# Patient Record
Sex: Male | Born: 1960 | Race: White | Hispanic: No | Marital: Married | Smoking: Never smoker
Health system: Southern US, Community
[De-identification: ages and names within clinical notes are randomized; demographics above are authoritative.]

## PROBLEM LIST (undated history)

## (undated) DIAGNOSIS — M1990 Unspecified osteoarthritis, unspecified site: Secondary | ICD-10-CM

## (undated) DIAGNOSIS — K219 Gastro-esophageal reflux disease without esophagitis: Secondary | ICD-10-CM

## (undated) DIAGNOSIS — E7800 Pure hypercholesterolemia, unspecified: Secondary | ICD-10-CM

## (undated) DIAGNOSIS — L719 Rosacea, unspecified: Secondary | ICD-10-CM

## (undated) DIAGNOSIS — Z8719 Personal history of other diseases of the digestive system: Secondary | ICD-10-CM

## (undated) DIAGNOSIS — F419 Anxiety disorder, unspecified: Secondary | ICD-10-CM

## (undated) DIAGNOSIS — R002 Palpitations: Secondary | ICD-10-CM

## (undated) DIAGNOSIS — I4891 Unspecified atrial fibrillation: Secondary | ICD-10-CM

## (undated) DIAGNOSIS — I10 Essential (primary) hypertension: Secondary | ICD-10-CM

## (undated) DIAGNOSIS — M48061 Spinal stenosis, lumbar region without neurogenic claudication: Secondary | ICD-10-CM

## (undated) DIAGNOSIS — K760 Fatty (change of) liver, not elsewhere classified: Secondary | ICD-10-CM

## (undated) HISTORY — DX: Rosacea, unspecified: L71.9

## (undated) HISTORY — DX: Anxiety disorder, unspecified: F41.9

## (undated) HISTORY — PX: APPENDECTOMY: SHX54

## (undated) HISTORY — PX: TONSILLECTOMY: SUR1361

## (undated) HISTORY — DX: Spinal stenosis, lumbar region without neurogenic claudication: M48.061

## (undated) HISTORY — PX: SHOULDER SURGERY: SHX246

## (undated) HISTORY — PX: KNEE SURGERY: SHX244

---

## 1997-10-15 ENCOUNTER — Ambulatory Visit (HOSPITAL_BASED_OUTPATIENT_CLINIC_OR_DEPARTMENT_OTHER): Admission: RE | Admit: 1997-10-15 | Discharge: 1997-10-15 | Payer: Self-pay | Admitting: Orthopedic Surgery

## 1997-12-31 ENCOUNTER — Encounter: Payer: Self-pay | Admitting: Emergency Medicine

## 1997-12-31 ENCOUNTER — Emergency Department (HOSPITAL_COMMUNITY): Admission: EM | Admit: 1997-12-31 | Discharge: 1997-12-31 | Payer: Self-pay | Admitting: Emergency Medicine

## 1998-11-05 ENCOUNTER — Encounter: Payer: Self-pay | Admitting: Internal Medicine

## 1998-11-05 ENCOUNTER — Ambulatory Visit (HOSPITAL_COMMUNITY): Admission: RE | Admit: 1998-11-05 | Discharge: 1998-11-05 | Payer: Self-pay | Admitting: Internal Medicine

## 1999-06-09 ENCOUNTER — Encounter: Payer: Self-pay | Admitting: Orthopedic Surgery

## 1999-06-09 ENCOUNTER — Encounter: Admission: RE | Admit: 1999-06-09 | Discharge: 1999-06-09 | Payer: Self-pay | Admitting: Orthopedic Surgery

## 1999-07-07 ENCOUNTER — Emergency Department (HOSPITAL_COMMUNITY): Admission: EM | Admit: 1999-07-07 | Discharge: 1999-07-07 | Payer: Self-pay | Admitting: Emergency Medicine

## 2003-05-29 ENCOUNTER — Emergency Department (HOSPITAL_COMMUNITY): Admission: EM | Admit: 2003-05-29 | Discharge: 2003-05-29 | Payer: Self-pay | Admitting: Emergency Medicine

## 2003-06-06 ENCOUNTER — Ambulatory Visit (HOSPITAL_BASED_OUTPATIENT_CLINIC_OR_DEPARTMENT_OTHER): Admission: RE | Admit: 2003-06-06 | Discharge: 2003-06-06 | Payer: Self-pay | Admitting: Urology

## 2003-06-06 ENCOUNTER — Encounter (INDEPENDENT_AMBULATORY_CARE_PROVIDER_SITE_OTHER): Payer: Self-pay | Admitting: Specialist

## 2003-06-06 ENCOUNTER — Ambulatory Visit (HOSPITAL_COMMUNITY): Admission: RE | Admit: 2003-06-06 | Discharge: 2003-06-06 | Payer: Self-pay | Admitting: Urology

## 2005-03-22 HISTORY — PX: CARDIAC CATHETERIZATION: SHX172

## 2005-07-13 ENCOUNTER — Ambulatory Visit: Payer: Self-pay | Admitting: Internal Medicine

## 2005-07-16 ENCOUNTER — Ambulatory Visit: Payer: Self-pay | Admitting: Internal Medicine

## 2005-07-16 ENCOUNTER — Inpatient Hospital Stay (HOSPITAL_BASED_OUTPATIENT_CLINIC_OR_DEPARTMENT_OTHER): Admission: RE | Admit: 2005-07-16 | Discharge: 2005-07-16 | Payer: Self-pay | Admitting: Internal Medicine

## 2005-07-21 ENCOUNTER — Ambulatory Visit: Payer: Self-pay | Admitting: Cardiology

## 2005-08-18 ENCOUNTER — Ambulatory Visit: Payer: Self-pay | Admitting: Internal Medicine

## 2005-08-18 ENCOUNTER — Ambulatory Visit: Payer: Self-pay

## 2005-08-18 ENCOUNTER — Encounter: Payer: Self-pay | Admitting: Cardiology

## 2005-08-20 ENCOUNTER — Ambulatory Visit: Payer: Self-pay | Admitting: Internal Medicine

## 2006-11-01 ENCOUNTER — Encounter (HOSPITAL_BASED_OUTPATIENT_CLINIC_OR_DEPARTMENT_OTHER): Payer: Self-pay | Admitting: General Surgery

## 2006-11-01 ENCOUNTER — Inpatient Hospital Stay (HOSPITAL_COMMUNITY): Admission: EM | Admit: 2006-11-01 | Discharge: 2006-11-02 | Payer: Self-pay | Admitting: Emergency Medicine

## 2006-12-02 ENCOUNTER — Encounter: Admission: RE | Admit: 2006-12-02 | Discharge: 2006-12-02 | Payer: Self-pay | Admitting: General Surgery

## 2008-08-24 ENCOUNTER — Emergency Department (HOSPITAL_COMMUNITY): Admission: EM | Admit: 2008-08-24 | Discharge: 2008-08-24 | Payer: Self-pay | Admitting: Emergency Medicine

## 2009-01-02 ENCOUNTER — Emergency Department (HOSPITAL_COMMUNITY): Admission: EM | Admit: 2009-01-02 | Discharge: 2009-01-02 | Payer: Self-pay | Admitting: Emergency Medicine

## 2010-06-25 LAB — POCT CARDIAC MARKERS
CKMB, poc: <1 ng/mL — ABNORMAL LOW (ref 1.0–8.0)
CKMB, poc: <1 ng/mL — ABNORMAL LOW (ref 1.0–8.0)
Myoglobin, poc: 66.8 ng/mL (ref 12–200)
Myoglobin, poc: 76.2 ng/mL (ref 12–200)
Troponin i, poc: <0.05 ng/mL (ref 0.00–0.09)
Troponin i, poc: <0.05 ng/mL (ref 0.00–0.09)

## 2010-08-04 NOTE — Op Note (Signed)
[name], Vincent Summers                ACCOUNT NO.:  [id number]   MEDICAL RECORD NO.:  [id number]          PATIENT TYPE:  INP   LOCATION:  5731                         FACILITY:  [facility]   PHYSICIAN:  Leonie Man, M.D.   DATE OF BIRTH:  11-22-1960   DATE OF PROCEDURE:  11/01/2006  DATE OF DISCHARGE:  11/02/2006                               OPERATIVE REPORT   PREOPERATIVE DIAGNOSIS:  Acute appendicitis.   POSTOPERATIVE DIAGNOSIS:  Acute appendicitis.   PROCEDURE:  Laparoscopic appendectomy.   SURGEON:  Leonie Man, M.D.   ASSISTANT:  Starling Manns, PA student   ANESTHESIA:  General.   NOTE:  Mr. Kamaka is a 50 year old man who started experiencing right  lower quadrant abdominal pain approximately 24 hours prior to admission.  This was associated with nausea but without vomiting.  The patient  presented to the emergency room where he was evaluated by CT scanning  which showed evidence suggestive of acute appendicitis.  The patient  comes to the operating room now after risks and potential benefits of  surgery have been discussed, all questions answered, and consent for  surgery obtained.   Following induction of satisfactory general anesthesia, the patient  positioned supinely, the abdomen prepped and draped to be included in a  sterile operative field.  Positive identification of the patient is  [name] was carried out.  An open laparoscopy is created at the  umbilicus with a supraumbilical incision, deepened through skin and  subcutaneous tissues through the linea alba into the peritoneal cavity.  A Hassan cannula was inserted and insufflation of the peritoneal cavity  to 14-15 mmHg pressure with carbon dioxide carried out.  The camera was  inserted. Visual exploration of the abdomen was carried out.  In the  right lower quadrant, there were some adhesions to the sidewall and a  partially retrocecal appendix was noted.  Under direct vision,  suprapubic and left lower  quadrant incisions were made and trocars  inserted.   The appendix was grasped and dissected free from surrounding organs.  I  carried the dissection up along the mesoappendix using the harmonic  scalpel to divide the mesoappendix.  The base of the appendix was then  transected with an Endo GIA staple vascular type.  The suture line was  noted to be clear without any additional bleeding.  The appendix was  placed in an Endopouch and retrieved through the umbilical wound without  difficulty.   The right lower quadrant was then thoroughly irrigated with multiple  aliquots of normal saline.  All trocars were then removed under direct  vision.  Sponge, instrument, and sharp counts were verified and the  wound was closed in layers as follows.  The left lower quadrant and  suprapubic wounds closed with a running 4-0 Monocryl suture.  The  umbilical wound closed in layers with a 0 Vicryl and 4-0 Monocryl.  These wounds both had Steri-Strips applied.  A sterile dressing was  applied, the anesthetic reversed, the patient removed from the operating  room to the recovery room in stable condition.  He tolerated the  procedure well.      Leonie Man, M.D.  Electronically Signed     PB/MEDQ  D:  11/01/2006  T:  11/02/2006  Job:  [id number]

## 2010-08-04 NOTE — H&P (Signed)
NAMEGINA, Summers                ACCOUNT NO.:  [id number]   MEDICAL RECORD NO.:  [id number]          Summers TYPE:  EMS   LOCATION:  MAJO                         FACILITY:  [facility]   PHYSICIAN:  Leonie Man, M.D.   DATE OF BIRTH:  1960-12-19   DATE OF ADMISSION:  11/01/2006  DATE OF DISCHARGE:                              HISTORY & PHYSICAL   CHIEF COMPLAINT:  Right lower quadrant abdominal pain.   HISTORY OF PRESENT ILLNESS:  Mr. Vincent Summers who  developed abrupt onset of sharp pain in the right lower quadrant on  October 31, 2006.  This pain only lasted a few minutes and resolved.  He  was awakened at 3:00 a.m. this morning with recurrence of this pain  which was much more intense and more constant.  He had subjective  fevers, he had chills, he had nausea without vomiting and loose stools.  He presented to the ER where he was found to have leukocytosis. A CT was  done that demonstrated findings consistent with an appendicitis.   REVIEW OF SYSTEMS:  The Summers has noticed increased pain with  activity. This is better since he received pain medication in the ER.   PAST MEDICAL HISTORY:  1. Hypertension.  2. GERD.   PAST SURGICAL HISTORY:  1. Five separate knee surgeries.  2. One shoulder surgery.  3. I&D of groin abscess.  4. Tonsillectomy.   FAMILY HISTORY:  Noncontributory.   SOCIAL HISTORY:  No alcohol and no tobacco.   DRUG ALLERGIES:  Paxil.   CURRENT MEDICATIONS:  1. Coreg dosage unknown.  2. Nexium 40 mg daily.   PHYSICAL EXAMINATION:  GENERAL:  Pleasant male Summers currently  reporting improved right lower quadrant pain after medications.  VITAL SIGNS:  Temperature 98.3, BP 151/96, pulse 73 and regular,  respirations 20.  NEURO:  Alert and oriented x3, moving all extremities x4.  No focal  deficits.  HEENT:  Head normocephalic.  Sclera noninjected.  NECK:  Supple. No appreciable adenopathy.  CHEST:  Bilateral lung sounds, clear  to auscultation.  Respiratory  effort nonlabored.  Room air.  CARDIAC:  S1, S2, no rubs, murmurs, thrills or gallops.  Pulses regular.  No JVD.  ABDOMEN:  Soft, nondistended.  Bowel sounds are present but diminished.  He is tender in the right lower quadrant with minimal guarding.  No  rebounding.  EXTREMITIES:  Symmetrical in appearance without edema, cyanosis or  clubbing.  Pulses are palpable.   LABORATORY DATA:  White count 14,700, neutrophils 82%, hemoglobin 15.2,  platelets 233,000.  Urinalysis is negative.  CT again shows trace fluid  in the pelvis consistent with probable appendicitis.  There is also an  indeterminate lesion in the upper pole of the left kidney that  radiologist is recommending additional imaging.   IMPRESSION:  1. Acute appendicitis.  2. Indeterminate lesion in upper pole left kidney.  3. Hypertension.   PLAN:  1. Admit the Summers to 5700.  2. Operative intervention today, a laparoscopic appendectomy per Dr.      Lurene Shadow. The risks and benefits discussed  with the Summers per Dr.      Lurene Shadow.  3. Empiric Unasyn for empiric antibiotic coverage.  4. Dilaudid for pain and Zofran for nausea.   At discharge, the Summers will need to be given a reminder that he needs  a contrast enhanced CT or MRI to evaluate the lesion on the left kidney.  We will need to communicate this information to him and to his primary  care physician at the time of discharge.      Allison L. Rennis Harding, N.P.      Leonie Man, M.D.  Electronically Signed    ALE/MEDQ  D:  11/01/2006  T:  11/02/2006  Job:  [id number]

## 2010-08-07 NOTE — Cardiovascular Report (Signed)
NAME:  Vincent Summers, Vincent Summers NO.:  [id number]   MEDICAL RECORD NO.:  [id number]          PATIENT TYPE:  OIB   LOCATION:  1966                         FACILITY:  [facility]   PHYSICIAN:  Arvilla Meres, M.D. LHCDATE OF BIRTH:  03/15/1961   DATE OF PROCEDURE:  07/16/2005  DATE OF DISCHARGE:                              CARDIAC CATHETERIZATION   PATIENT IDENTIFICATION:  Vincent Summers is a very pleasant 50 year old male with  a history of hypertension and hyperlipidemia as well as a strong family  history of coronary artery disease.  He has been having about a month  history of exertional chest pain concerning for angina.  He is, thus,  referred for cardiac catheterization in the outpatient diagnostic lab.   PROCEDURES PERFORMED:  1.  Selective coronary artery angiography.  2.  Left heart catheterization.  3.  Left ventriculogram.  4.  Abdominal aortogram.   DESCRIPTION OF PROCEDURE:  The risks and benefits of catheterization were  explained.  Consent was signed and placed on the chart.  A 4-French arterial  sheath was placed in the right femoral artery using a modified Seldinger  technique.  A JL-5 catheter was used to image the left coronary system and a  3-DRC was used to image.  An angled pigtail was used for the left heart  catheterization.  All catheter exchanges were made over a wire.  There are  no apparent complications.   Central aortic pressure 133/83 with mean of 107.  LV pressure 14/9 with  LVEDP of 22.  There is no aortic stenosis.   The left main was long and angiographically normal.   The LAD was a moderate sized vessel that gave off two large diagonals, the  second diagonal being about as large if not larger than the LAD.  There are  minor luminal irregularties in the diagonal.   The left circumflex is a moderate sized system made up primarily of a large  OM1.  The distal AV groove circumflex was small.  There is no angiographic  CAD.   The right  coronary artery was a very large dominant vessel that gave off a  small RV branch, a very large PDA, and two moderate sized PLs.  There is no  angiographic CAD.   The left ventriculogram done in the RAO position showed an EF of 60% with no  obvious wall motion abnormalities.   Abdominal aortogram showed patent renal arteries bilaterally with no  evidence of abdominal aortoiliac disease.   ASSESSMENT:  1.  Essentially normal coronary arteries.  2.  Normal LV function with increase LVEDP consistent with diastolic      dysfunction.  3.  Normal renal arteries with no evidence of abdominal aortic aneurysm.   PLAN/DISCUSSION:  I am unsure what is causing Vincent Summers's chest pressure,  however, it does not appear to be cardiac.  We will continue with risk  factor modification.  Should it persist, we will consider workup for GI  causes versus possible pulmonary embolus, though I suspect the latter is  unlikely.      Arvilla Meres, M.D. [facility]  Electronically Signed     DB/MEDQ  D:  07/16/2005  T:  07/17/2005  Job:  [id number]   cc:   Johnney Ou, M.D.

## 2010-08-07 NOTE — Op Note (Signed)
NAME:  JOH, RAO                          ACCOUNT NO.:  [id number]   MEDICAL RECORD NO.:  [id number]                   PATIENT TYPE:  AMB   LOCATION:  [facility]                                 [facility]   PHYSICIAN:  Excell Seltzer. Annabell Howells, M.D.                 DATE OF BIRTH:  Sep 09, 1960   DATE OF PROCEDURE:  06/06/2003  DATE OF DISCHARGE:                                 OPERATIVE REPORT   PROCEDURE:  Excision of scrotal phlegmon with 5 cm intermediate closure.   PREOPERATIVE DIAGNOSES:  Scrotal abscess.   POSTOPERATIVE DIAGNOSES:  Scrotal abscess.   SURGEON:  Excell Seltzer. Annabell Howells, M.D.   ANESTHESIA:  General.   COMPLICATIONS:  None.   INDICATIONS FOR PROCEDURE:  Mr. Zerby is a 50 year old white male who  initially presented with what appeared to be a left upper scrotal abscess.  He underwent incision and drainage in the emergency room and was given  Augmentin.  He was seen by me in followup a few days later and had  persistent erythema and adenopathy in the right inguinal area with  persistent phlegmon.  He underwent reincision and drainage. There was  minimal purulent material. His antibiotics were augmented with Levaquin. He  returned three days later in followup and had persistent erythema and  adenopathy with a persistent phlegmon. I have switched him to doxycycline  100 mg p.o. b.i.d. and felt that excision of the phlegmon was indicated to  rule out any sort of necrotizing process.   DESCRIPTION OF PROCEDURE:  The patient was taken to the operating room where  general anesthesia was induced. The scrotum and left inguinal area were  shaved, he was prepped with Betadine solution and draped in the usual  sterile fashion.  The scrotal phlegmon at the left base of the penis was  excised with an elliptical skin excision and removed using the Bovie.  There  was no evidence of a fistulous tract or deep invasion although it was full  thickness through the scrotal wall. Once the phlegmon  was excised,  hemostasis was achieved.  A 1/4 inch Penrose drain was placed through a  separate stab wound inferior to the incision. The incision was then closed  using interrupted 3-0 chromic on the dartos and interrupted vertical  mattress 3-0 chromic on the skin. A dressing of 4 x 4, Kerlix and athletic  supporter was applied. This specimen was sent to pathology for microscopy  and cultures for aerobes, anaerobes, AFB and chlamydia.                                               Excell Seltzer. Annabell Howells, M.D.    JJW/MEDQ  D:  06/06/2003  T:  06/06/2003  Job:  [id number]

## 2011-01-04 LAB — DIFFERENTIAL
Basophils Relative: 0
Eosinophils Absolute: 0.2
Eosinophils Relative: 2
Monocytes Absolute: 1 — ABNORMAL HIGH
Monocytes Relative: 7

## 2011-01-04 LAB — CBC
HCT: 43.3
Hemoglobin: 15.2
MCHC: 35.1
MCV: 88.3
RBC: 4.91

## 2011-01-04 LAB — URINALYSIS, ROUTINE W REFLEX MICROSCOPIC
Bilirubin Urine: NEGATIVE
Hgb urine dipstick: NEGATIVE
Ketones, ur: NEGATIVE
Specific Gravity, Urine: 1.02
pH: 6

## 2011-01-04 LAB — BASIC METABOLIC PANEL
CO2: 23
Chloride: 104
GFR calc Af Amer: >60
Sodium: 135

## 2011-04-04 ENCOUNTER — Encounter (HOSPITAL_COMMUNITY): Payer: Self-pay | Admitting: *Deleted

## 2011-04-04 ENCOUNTER — Emergency Department (HOSPITAL_COMMUNITY)
Admission: EM | Admit: 2011-04-04 | Discharge: 2011-04-04 | Disposition: A | Discharge status: Home / Self Care | Payer: BC Managed Care – PPO | Attending: Emergency Medicine | Admitting: Emergency Medicine

## 2011-04-04 DIAGNOSIS — K089 Disorder of teeth and supporting structures, unspecified: Secondary | ICD-10-CM | POA: Insufficient documentation

## 2011-04-04 DIAGNOSIS — K029 Dental caries, unspecified: Secondary | ICD-10-CM | POA: Insufficient documentation

## 2011-04-04 DIAGNOSIS — K0889 Other specified disorders of teeth and supporting structures: Secondary | ICD-10-CM

## 2011-04-04 DIAGNOSIS — I10 Essential (primary) hypertension: Secondary | ICD-10-CM | POA: Insufficient documentation

## 2011-04-04 DIAGNOSIS — Z79899 Other long term (current) drug therapy: Secondary | ICD-10-CM | POA: Insufficient documentation

## 2011-04-04 HISTORY — DX: Essential (primary) hypertension: I10

## 2011-04-04 MED ORDER — HYDROCODONE-ACETAMINOPHEN 5-325 MG PO TABS
2.0000 | ORAL_TABLET | Freq: Once | ORAL | Status: AC
  Administered 2011-04-04: 2 via ORAL
  Filled 2011-04-04 (×2): qty 1

## 2011-04-04 MED ORDER — HYDROCODONE-ACETAMINOPHEN 5-325 MG PO TABS: 2.0000 | ORAL_TABLET | ORAL | Status: AC | PRN

## 2011-04-04 MED ORDER — PENICILLIN V POTASSIUM 250 MG PO TABS
500.0000 mg | ORAL_TABLET | Freq: Once | ORAL | Status: AC
  Administered 2011-04-04: 500 mg via ORAL
  Filled 2011-04-04: qty 2

## 2011-04-04 MED ORDER — PENICILLIN V POTASSIUM 500 MG PO TABS: 500.0000 mg | ORAL_TABLET | Freq: Three times a day (TID) | ORAL | Status: AC

## 2011-04-04 NOTE — ED Provider Notes (Signed)
History     CSN: [id number]  Arrival date & time 04/04/11  2011   First MD Initiated Contact with Patient 04/04/11 2150      Chief Complaint  Patient presents with  . Dental Pain    (Consider location/radiation/quality/duration/timing/severity/associated sxs/prior treatment) HPI Complains of toothache at right lower 2 molars onset 2 days ago when his dental cap Fell off. Treated with ibuprofen without adequate pain relief no fever no other complaint no other associated symptoms pain is worse with palpation not improved by anything nonradiating moderate to severe Past Medical History  Diagnosis Date  . Hypertension     History reviewed. No pertinent past surgical history.  History reviewed. No pertinent family history.  History  Substance Use Topics  . Smoking status: Never Smoker   . Smokeless tobacco: Not on file  . Alcohol Use: Yes      Review of Systems  Constitutional: Negative.   HENT:       Toothache  Respiratory: Negative.   Cardiovascular: Negative.   Gastrointestinal: Negative.   Musculoskeletal: Negative.   Skin: Negative.   Neurological: Negative.   Hematological: Negative.   Psychiatric/Behavioral: Negative.     Allergies  Review of patient's allergies indicates no known allergies.  Home Medications   Current Outpatient Rx  Name Route Sig Dispense Refill  . BENAZEPRIL HCL 5 MG PO TABS Oral Take 5 mg by mouth daily.    . IBUPROFEN 600 MG PO TABS Oral Take 600 mg by mouth every 4 (four) hours as needed. For pain    . OMEPRAZOLE 20 MG PO CPDR Oral Take 20 mg by mouth daily.      BP 146/80  Pulse 76  Temp(Src) 98.4 F (36.9 C) (Oral)  Resp 15  SpO2 97%  Physical Exam  Constitutional: He appears well-developed and well-nourished. No distress.  HENT:  Head: Normocephalic and atraumatic.       Dental carry at right lower third molar; dental cap missing from right lower second molar uvula midline no trismus. No fluctuance of gingivae  Eyes:  Conjunctivae are normal. Pupils are equal, round, and reactive to light.  Neck: Neck supple. No tracheal deviation present. No thyromegaly present.  Cardiovascular: Normal rate.   Pulmonary/Chest: Effort normal.  Abdominal: He exhibits no distension.  Musculoskeletal: Normal range of motion. He exhibits no edema.  Neurological: He is alert.  Skin: Skin is warm and dry. No rash noted.  Psychiatric: He has a normal mood and affect.    ED Course  Procedures (including critical care time)  Labs Reviewed - No data to display No results found.   No diagnosis found.    MDM  Plan prescriptions hydrocodone-A. Pap Pen-Vee K Dental referral Diagnosis dental pain        Doug Sou, MD 04/04/11 2202

## 2011-04-04 NOTE — ED Notes (Signed)
The pt has had a toothache since Friday when a cap fell off

## 2011-05-31 ENCOUNTER — Emergency Department (HOSPITAL_COMMUNITY)
Admission: EM | Admit: 2011-05-31 | Discharge: 2011-05-31 | Disposition: A | Discharge status: Home / Self Care | Payer: BC Managed Care – PPO | Attending: Emergency Medicine | Admitting: Emergency Medicine

## 2011-05-31 ENCOUNTER — Encounter (HOSPITAL_COMMUNITY): Payer: Self-pay

## 2011-05-31 DIAGNOSIS — M25559 Pain in unspecified hip: Secondary | ICD-10-CM | POA: Insufficient documentation

## 2011-05-31 DIAGNOSIS — IMO0001 Reserved for inherently not codable concepts without codable children: Secondary | ICD-10-CM | POA: Insufficient documentation

## 2011-05-31 DIAGNOSIS — I10 Essential (primary) hypertension: Secondary | ICD-10-CM | POA: Insufficient documentation

## 2011-05-31 DIAGNOSIS — M545 Low back pain, unspecified: Secondary | ICD-10-CM | POA: Insufficient documentation

## 2011-05-31 DIAGNOSIS — M549 Dorsalgia, unspecified: Secondary | ICD-10-CM

## 2011-05-31 LAB — URINALYSIS, ROUTINE W REFLEX MICROSCOPIC
Glucose, UA: NEGATIVE mg/dL
Ketones, ur: NEGATIVE mg/dL
Leukocytes, UA: NEGATIVE
pH: 5 (ref 5.0–8.0)

## 2011-05-31 MED ORDER — HYDROMORPHONE HCL PF 1 MG/ML IJ SOLN
1.0000 mg | Freq: Once | INTRAMUSCULAR | Status: AC
  Administered 2011-05-31: 1 mg via INTRAMUSCULAR
  Filled 2011-05-31: qty 1

## 2011-05-31 MED ORDER — IBUPROFEN 800 MG PO TABS: 800.0000 mg | ORAL_TABLET | Freq: Three times a day (TID) | ORAL | Status: AC

## 2011-05-31 MED ORDER — DIAZEPAM 5 MG PO TABS
5.0000 mg | ORAL_TABLET | Freq: Once | ORAL | Status: AC
  Administered 2011-05-31: 5 mg via ORAL
  Filled 2011-05-31: qty 1

## 2011-05-31 MED ORDER — KETOROLAC TROMETHAMINE 60 MG/2ML IM SOLN
60.0000 mg | Freq: Once | INTRAMUSCULAR | Status: AC
  Administered 2011-05-31: 60 mg via INTRAMUSCULAR
  Filled 2011-05-31: qty 2

## 2011-05-31 MED ORDER — OXYCODONE-ACETAMINOPHEN 5-325 MG PO TABS: 1.0000 | ORAL_TABLET | Freq: Four times a day (QID) | ORAL | Status: AC | PRN

## 2011-05-31 MED ORDER — DIAZEPAM 5 MG PO TABS: 5.0000 mg | ORAL_TABLET | Freq: Three times a day (TID) | ORAL | Status: AC | PRN

## 2011-05-31 NOTE — ED Notes (Signed)
Lower back pain last week, denies any injuries.  Pain has become severe.  Pt. Denies any hematuria or dsyuria

## 2011-05-31 NOTE — ED Provider Notes (Signed)
Medical screening examination/treatment/procedure(s) were performed by non-physician practitioner and as supervising physician I was immediately available for consultation/collaboration.   Loren Racer, MD 05/31/11 [phone]

## 2011-05-31 NOTE — Discharge Instructions (Signed)
Back Pain, Adult Low back pain is very common. About 1 in 5 people have back pain.The cause of low back pain is rarely dangerous. The pain often gets better over time.About half of people with a sudden onset of back pain feel better in just 2 weeks. About 8 in 10 people feel better by 6 weeks.  CAUSES Some common causes of back pain include:  Strain of the muscles or ligaments supporting the spine.   Wear and tear (degeneration) of the spinal discs.   Arthritis.   Direct injury to the back.  DIAGNOSIS Most of the time, the direct cause of low back pain is not known.However, back pain can be treated effectively even when the exact cause of the pain is unknown.Answering your caregiver's questions about your overall health and symptoms is one of the most accurate ways to make sure the cause of your pain is not dangerous. If your caregiver needs more information, he or she may order lab work or imaging tests (X-rays or MRIs).However, even if imaging tests show changes in your back, this usually does not require surgery. HOME CARE INSTRUCTIONS For many people, back pain returns.Since low back pain is rarely dangerous, it is often a condition that people can learn to manageon their own.   Remain active. It is stressful on the back to sit or stand in one place. Do not sit, drive, or stand in one place for more than 30 minutes at a time. Take short walks on level surfaces as soon as pain allows.Try to increase the length of time you walk each day.   Do not stay in bed.Resting more than 1 or 2 days can delay your recovery.   Do not avoid exercise or work.Your body is made to move.It is not dangerous to be active, even though your back may hurt.Your back will likely heal faster if you return to being active before your pain is gone.   Pay attention to your body when you bend and lift. Many people have less discomfortwhen lifting if they bend their knees, keep the load close to their  bodies,and avoid twisting. Often, the most comfortable positions are those that put less stress on your recovering back.   Find a comfortable position to sleep. Use a firm mattress and lie on your side with your knees slightly bent. If you lie on your back, put a pillow under your knees.   Only take over-the-counter or prescription medicines as directed by your caregiver. Over-the-counter medicines to reduce pain and inflammation are often the most helpful.Your caregiver may prescribe muscle relaxant drugs.These medicines help dull your pain so you can more quickly return to your normal activities and healthy exercise.   Put ice on the injured area.   Put ice in a plastic bag.   Place a towel between your skin and the bag.   Leave the ice on for 15 to 20 minutes, 3 to 4 times a day for the first 2 to 3 days. After that, ice and heat may be alternated to reduce pain and spasms.   Ask your caregiver about trying back exercises and gentle massage. This may be of some benefit.   Avoid feeling anxious or stressed.Stress increases muscle tension and can worsen back pain.It is important to recognize when you are anxious or stressed and learn ways to manage it.Exercise is a great option.  SEEK MEDICAL CARE IF:  You have pain that is not relieved with rest or medicine.   You have   pain that does not improve in 1 week.   You have new symptoms.   You are generally not feeling well.  SEEK IMMEDIATE MEDICAL CARE IF:   You have pain that radiates from your back into your legs.   You develop new bowel or bladder control problems.   You have unusual weakness or numbness in your arms or legs.   You develop nausea or vomiting.   You develop abdominal pain.   You feel faint.  Document Released: 03/08/2005 Document Revised: 02/25/2011 Document Reviewed: 07/27/2010 [facility] Patient Information 2012 [place], [place].        Diazepam tablets What is this medicine? DIAZEPAM (dye AZ e  pam) is a benzodiazepine. It is used to treat anxiety and nervousness. It also can help treat alcohol withdrawal, relax muscles, and treat certain types of seizures. This medicine may be used for other purposes; ask your health care provider or pharmacist if you have questions. What should I tell my health care provider before I take this medicine? They need to know if you have any of these conditions -an alcohol or drug abuse problem -bipolar disorder, depression, psychosis or other mental health condition -glaucoma -kidney or liver disease -lung or breathing disease -myasthenia gravis -Parkinson's disease -seizures or a history of seizures -suicidal thoughts -an unusual or allergic reaction to diazepam, other benzodiazepines, foods, dyes, or preservatives -pregnant or trying to get pregnant -breast-feeding How should I use this medicine? Take this medicine by mouth with a glass of water. Follow the directions on the prescription label. If this medicine upsets your stomach, take it with food or milk. Take your doses at regular intervals. Do not take your medicine more often than directed. If you have been taking this medicine regularly for some time, do not suddenly stop taking it. You must gradually reduce the dose or you may get severe side effects. Ask your doctor or health care professional for advice. Even after you stop taking this medicine it can still affect your body for several days. Talk to your pediatrician regarding the use of this medicine in children. Special care may be needed. Overdosage: If you think you have taken too much of this medicine contact a poison control center or emergency room at once. NOTE: This medicine is only for you. Do not share this medicine with others. What if I miss a dose? If you miss a dose, take it as soon as you can. If it is almost time for your next dose, take only that dose. Do not take double or extra doses. What may interact with this  medicine? -cimetidine -grapefruit juice -herbal or dietary supplements like kava kava, melatonin, St. John's Wort, or valerian -medicines for anxiety or sleeping problems, like alprazolam, lorazepam, or triazolam -medicines for depression, mental problems or psychiatric disturbances -medicines for HIV infection or AIDS -prescription pain medicines -rifampin, rifapentine, or rifabutin -some medicines for seizures like carbamazepine, phenobarbital, phenytoin, or primidone This list may not describe all possible interactions. Give your health care provider a list of all the medicines, herbs, non-prescription drugs, or dietary supplements you use. Also tell them if you smoke, drink alcohol, or use illegal drugs. Some items may interact with your medicine. What should I watch for while using this medicine? Visit your doctor or health care professional for regular checks on your progress. Your body can become dependent on this medicine. Ask your doctor or health care professional if you still need to take it. You may get drowsy or dizzy. Do  not drive, use machinery, or do anything that needs mental alertness until you know how this medicine affects you. To reduce the risk of dizzy and fainting spells, do not stand or sit up quickly, especially if you are an older patient. Alcohol may increase dizziness and drowsiness. Avoid alcoholic drinks. Do not treat yourself for coughs, colds or allergies without asking your doctor or health care professional for advice. Some ingredients can increase possible side effects. What side effects may I notice from receiving this medicine? Side effects that you should report to your doctor or health care professional as soon as possible: -allergic reactions like skin rash, itching or hives, swelling of the face, lips, or tongue -angry, confused, depressed, other mood changes -breathing problems -feeling faint or lightheaded, falls -muscle cramps -problems with balance,  talking, walking -restlessness -tremors -trouble passing urine or change in the amount of urine -unusually weak or tired Side effects that usually do not require medical attention (report to your doctor or health care professional if they continue or are bothersome): -difficulty sleeping, nightmares -dizziness, drowsiness, clumsiness, or unsteadiness, a hangover effect -headache -nausea, vomiting This list may not describe all possible side effects. Call your doctor for medical advice about side effects. You may report side effects to FDA at 1-800-FDA-1088. Where should I keep my medicine? Keep out of the reach of children. This medicine can be abused. Keep your medicine in a safe place to protect it from theft. Do not share this medicine with anyone. Selling or giving away this medicine is dangerous and against the law. Store at room temperature between 15 and 30 degrees C (59 and 86 degrees F). Protect from light. Keep container tightly closed. Throw away any unused medicine after the expiration date. NOTE: This sheet is a summary. It may not cover all possible information. If you have questions about this medicine, talk to your doctor, pharmacist, or health care provider.  2012, Elsevier/Gold Standard. (06/26/2007 4:57:35 PM)        Acetaminophen; Oxycodone tablets What is this medicine? ACETAMINOPHEN; OXYCODONE (a set a MEE noe fen; ox i KOE done) is a pain reliever. It is used to treat mild to moderate pain. This medicine may be used for other purposes; ask your health care provider or pharmacist if you have questions. What should I tell my health care provider before I take this medicine? They need to know if you have any of these conditions: -brain tumor -Crohn's disease, inflammatory bowel disease, or ulcerative colitis -drink more than 3 alcohol containing drinks per day -drug abuse or addiction -head injury -heart or circulation problems -kidney disease or problems going  to the bathroom -liver disease -lung disease, asthma, or breathing problems -an unusual or allergic reaction to acetaminophen, oxycodone, other opioid analgesics, other medicines, foods, dyes, or preservatives -pregnant or trying to get pregnant -breast-feeding How should I use this medicine? Take this medicine by mouth with a full glass of water. Follow the directions on the prescription label. Take your medicine at regular intervals. Do not take your medicine more often than directed. Talk to your pediatrician regarding the use of this medicine in children. Special care may be needed. Patients over 79 years old may have a stronger reaction and need a smaller dose. Overdosage: If you think you have taken too much of this medicine contact a poison control center or emergency room at once. NOTE: This medicine is only for you. Do not share this medicine with others. What if I miss a  dose? If you miss a dose, take it as soon as you can. If it is almost time for your next dose, take only that dose. Do not take double or extra doses. What may interact with this medicine? -alcohol or medicines that contain alcohol -antihistamines -barbiturates like amobarbital, butalbital, butabarbital, methohexital, pentobarbital, phenobarbital, thiopental, and secobarbital -benztropine -drugs for bladder problems like solifenacin, trospium, oxybutynin, tolterodine, hyoscyamine, and methscopolamine -drugs for breathing problems like ipratropium and tiotropium -drugs for certain stomach or intestine problems like propantheline, homatropine methylbromide, glycopyrrolate, atropine, belladonna, and dicyclomine -general anesthetics like etomidate, ketamine, nitrous oxide, propofol, desflurane, enflurane, halothane, isoflurane, and sevoflurane -medicines for depression, anxiety, or psychotic disturbances -medicines for pain like codeine, morphine, pentazocine, buprenorphine, butorphanol, nalbuphine, tramadol, and  propoxyphene -medicines for sleep -muscle relaxants -naltrexone -phenothiazines like perphenazine, thioridazine, chlorpromazine, mesoridazine, fluphenazine, prochlorperazine, promazine, and trifluoperazine -scopolamine -trihexyphenidyl This list may not describe all possible interactions. Give your health care provider a list of all the medicines, herbs, non-prescription drugs, or dietary supplements you use. Also tell them if you smoke, drink alcohol, or use illegal drugs. Some items may interact with your medicine. What should I watch for while using this medicine? Tell your doctor or health care professional if your pain does not go away, if it gets worse, or if you have new or a different type of pain. You may develop tolerance to the medicine. Tolerance means that you will need a higher dose of the medication for pain relief. Tolerance is normal and is expected if you take this medicine for a long time. Do not suddenly stop taking your medicine because you may develop a severe reaction. Your body becomes used to the medicine. This does NOT mean you are addicted. Addiction is a behavior related to getting and using a drug for a nonmedical reason. If you have pain, you have a medical reason to take pain medicine. Your doctor will tell you how much medicine to take. If your doctor wants you to stop the medicine, the dose will be slowly lowered over time to avoid any side effects. You may get drowsy or dizzy. Do not drive, use machinery, or do anything that needs mental alertness until you know how this medicine affects you. Do not stand or sit up quickly, especially if you are an older patient. This reduces the risk of dizzy or fainting spells. Alcohol may interfere with the effect of this medicine. Avoid alcoholic drinks. The medicine will cause constipation. Try to have a bowel movement at least every 2 to 3 days. If you do not have a bowel movement for 3 days, call your doctor or health care  professional. Do not take Tylenol (acetaminophen) or medicines that have acetaminophen with this medicine. Too much acetaminophen can be very dangerous. Many nonprescription medicines contain acetaminophen. Always read the labels carefully to avoid taking more acetaminophen. What side effects may I notice from receiving this medicine? Side effects that you should report to your doctor or health care professional as soon as possible: -allergic reactions like skin rash, itching or hives, swelling of the face, lips, or tongue -breathing difficulties, wheezing -confusion -light headedness or fainting spells -severe stomach pain -yellowing of the skin or the whites of the eyes Side effects that usually do not require medical attention (report to your doctor or health care professional if they continue or are bothersome): -dizziness -drowsiness -nausea -vomiting This list may not describe all possible side effects. Call your doctor for medical advice about side effects. You  may report side effects to FDA at 1-800-FDA-1088. Where should I keep my medicine? Keep out of the reach of children. This medicine can be abused. Keep your medicine in a safe place to protect it from theft. Do not share this medicine with anyone. Selling or giving away this medicine is dangerous and against the law. Store at room temperature between 20 and 25 degrees C (68 and 77 degrees F). Keep container tightly closed. Protect from light. Flush any unused medicines down the toilet. Do not use the medicine after the expiration date. NOTE: This sheet is a summary. It may not cover all possible information. If you have questions about this medicine, talk to your doctor, pharmacist, or health care provider.  2012, Elsevier/Gold Standard. (02/05/2008 10:01:21 AM)

## 2011-05-31 NOTE — ED Provider Notes (Signed)
History     CSN: [id number]  Arrival date & time 05/31/11  4098   First MD Initiated Contact with Patient 05/31/11 [phone]      Chief Complaint  Patient presents with  . Back Pain    (Consider location/radiation/quality/duration/timing/severity/associated sxs/prior treatment) Patient is a 51 y.o. male presenting with back pain. The history is provided by the patient and the spouse.  Back Pain  This is a new problem. Episode onset: 5 days ago. The problem occurs constantly. 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 Pain location: Left lower back and buttock. The quality of the pain is described as stabbing. The pain radiates to the left thigh. The pain is severe. The symptoms are aggravated by bending, twisting and certain positions. The pain is the same all the time. Associated symptoms include leg pain. Pertinent negatives include no chest pain, no fever, no numbness, no headaches, no abdominal pain, no bowel incontinence, no perianal numbness, no bladder incontinence, no dysuria, no pelvic pain, no paresthesias, no paresis, no tingling and no weakness. Treatments tried: Aspirin. The treatment provided no relief.   no history of HIV, cancer, IV drug use.  Past Medical History  Diagnosis Date  . Hypertension     Past Surgical History  Procedure Date  . Knee surgery     No family history on file.  History  Substance Use Topics  . Smoking status: Never Smoker   . Smokeless tobacco: Not on file  . Alcohol Use: Yes      Review of Systems  Constitutional: Negative for fever and chills.  Cardiovascular: Negative for chest pain.  Gastrointestinal: Negative for abdominal pain and bowel incontinence.  Genitourinary: Negative for bladder incontinence, dysuria and pelvic pain.  Musculoskeletal: Positive for back pain.  Neurological: Negative for tingling, weakness, numbness,  headaches and paresthesias.  All other systems reviewed and are negative.    Allergies  Review of patient's allergies indicates no known allergies.  Home Medications   Current Outpatient Rx  Name Route Sig Dispense Refill  . ASPIRIN 325 MG PO TABS Oral Take 325 mg by mouth daily as needed. For pain    . BENAZEPRIL HCL 5 MG PO TABS Oral Take 5 mg by mouth daily.    Marland Kitchen OMEPRAZOLE 20 MG PO CPDR Oral Take 20 mg by mouth daily.      BP 144/84  Pulse 77  Resp 18  Ht 6\' 2"  (1.88 m)  Wt 225 lb (102.059 kg)  BMI 28.89 kg/m2  SpO2 98%  Physical Exam  Nursing note and vitals reviewed. Constitutional: He is oriented to person, place, and time. He appears well-developed and well-nourished. Distressed: uncomfortable appearing.  HENT:  Head: Normocephalic and atraumatic.  Right Ear: External ear normal.  Left Ear: External ear normal.  Mouth/Throat: Oropharynx is clear and moist.  Eyes: Pupils are equal, round, and reactive to light.  Neck: Normal range of motion. Neck supple.  Cardiovascular: Normal rate, regular rhythm, normal heart sounds and intact distal pulses.   No murmur heard. Pulmonary/Chest: Effort normal and breath sounds normal. No respiratory distress. He has no wheezes. He exhibits no tenderness.  Abdominal: Soft. Bowel sounds are normal. He exhibits no distension. There is no tenderness. There is no rebound and no guarding.  Musculoskeletal: Normal range of motion. He exhibits no edema and no tenderness.       No C T L spine  tenderness, deformity, stepoff, or palpable spasm. Left straight leg raise, trunk rotation, trunk flexion all elicit pain over the left lower back, left buttock. Strength in BLE major muscle groups 5/5 and symmetric bilaterally  Neurological: He is alert and oriented to person, place, and time. He has normal reflexes. No cranial nerve deficit. Coordination normal.       Sensation intact to light touch and symmetric in BLE. Gait with antalgia but no  ataxia. Foot plantar/dorsi flexion and great toe flex/ext intact with 5/5 strength bilaterally.  Skin: Skin is warm and dry. No rash noted.  Psychiatric: He has a normal mood and affect.    ED Course  Procedures (including critical care time)   Labs Reviewed  URINALYSIS, ROUTINE W REFLEX MICROSCOPIC   No results found.   Dx 1: back pain   MDM  Relatively healthy 51 y/o M with new onset left lower back pain with suspected msk etiology. UA without blood to suggest ureteral colic. Pain reproducible with movement, supports muscular pain. No midline or SI joint tenderness to suggest imaging studies warranted. No back pain red flags. Pain improved after IM dilaudid. Will d/c home with muscle relaxer, pain medication, NSAID. Pt requests note for work as well. As he performs heavy lifting that I suspect is worsening his pain, will write out for several days.         Shaaron Adler, PA-C 05/31/11 1303

## 2011-06-03 ENCOUNTER — Encounter (HOSPITAL_COMMUNITY): Payer: Self-pay | Admitting: *Deleted

## 2011-06-03 ENCOUNTER — Emergency Department (HOSPITAL_COMMUNITY)
Admission: EM | Admit: 2011-06-03 | Discharge: 2011-06-03 | Disposition: A | Discharge status: Home / Self Care | Payer: BC Managed Care – PPO | Attending: Emergency Medicine | Admitting: Emergency Medicine

## 2011-06-03 DIAGNOSIS — M545 Low back pain, unspecified: Secondary | ICD-10-CM | POA: Insufficient documentation

## 2011-06-03 DIAGNOSIS — M79609 Pain in unspecified limb: Secondary | ICD-10-CM | POA: Insufficient documentation

## 2011-06-03 MED ORDER — DIAZEPAM 5 MG PO TABS: 5.0000 mg | ORAL_TABLET | Freq: Once | ORAL | Status: AC

## 2011-06-03 MED ORDER — DIAZEPAM 5 MG PO TABS
5.0000 mg | ORAL_TABLET | Freq: Once | ORAL | Status: AC
  Administered 2011-06-03: 5 mg via ORAL
  Filled 2011-06-03: qty 1

## 2011-06-03 MED ORDER — OXYCODONE-ACETAMINOPHEN 5-325 MG PO TABS: 1.0000 | ORAL_TABLET | Freq: Four times a day (QID) | ORAL | Status: AC | PRN

## 2011-06-03 MED ORDER — OXYCODONE-ACETAMINOPHEN 5-325 MG PO TABS
2.0000 | ORAL_TABLET | Freq: Once | ORAL | Status: AC
  Administered 2011-06-03: 2 via ORAL
  Filled 2011-06-03: qty 2

## 2011-06-03 NOTE — ED Provider Notes (Signed)
History     CSN: [id number]  Arrival date & time 06/03/11  2139   First MD Initiated Contact with Patient 06/03/11 2310      Chief Complaint  Patient presents with  . Back Pain    (Consider location/radiation/quality/duration/timing/severity/associated sxs/prior treatment) HPI Comments: Patient was initially seen on the 11th for acute onset of low back pain radiating to his left leg.  He was initially treated with Percocet and Valium, which she states does help.  He was put on home rest, which he says helps.  He started moving around more in the last one to 2 days and has had an increase of the low back pain.  He is currently out of his medication.  He does have an appointment with [facility] on Monday, which is his primary care practice is requesting additional medication.  Through that.  He denies injury, previous back injuries, trauma, loss of continence, and constipation  Patient is a 51 y.o. male presenting with back pain. The history is provided by the patient.  Back Pain  This is a recurrent problem. The current episode started more than 1 week ago. The problem occurs constantly. The problem has not changed since onset.The pain is associated with no known injury. The pain is present in the lumbar spine. The pain radiates to the left thigh. The pain is at a severity of 7/10. The pain is moderate. The symptoms are aggravated by bending, twisting and certain positions. Pertinent negatives include no chest pain, no fever, no numbness and no weakness.    Past Medical History  Diagnosis Date  . Hypertension     Past Surgical History  Procedure Date  . Knee surgery     No family history on file.  History  Substance Use Topics  . Smoking status: Never Smoker   . Smokeless tobacco: Not on file  . Alcohol Use: Yes      Review of Systems  Constitutional: Negative for fever and chills.  Respiratory: Negative for cough and shortness of breath.     Cardiovascular: Negative for chest pain.  Gastrointestinal: Negative for nausea, vomiting and constipation.  Musculoskeletal: Positive for back pain. Negative for myalgias.  Skin: Negative for wound.  Neurological: Negative for dizziness, weakness and numbness.    Allergies  Review of patient's allergies indicates no known allergies.  Home Medications   Current Outpatient Rx  Name Route Sig Dispense Refill  . BENAZEPRIL HCL 5 MG PO TABS Oral Take 5 mg by mouth daily.    Marland Kitchen DIAZEPAM 5 MG PO TABS Oral Take 1 tablet (5 mg total) by mouth every 8 (eight) hours as needed (for muscle spasm). 12 tablet 0  . IBUPROFEN 800 MG PO TABS Oral Take 1 tablet (800 mg total) by mouth 3 (three) times daily. Take with food. 12 tablet 0  . OMEPRAZOLE 20 MG PO CPDR Oral Take 20 mg by mouth daily.    . OXYCODONE-ACETAMINOPHEN 5-325 MG PO TABS Oral Take 1-2 tablets by mouth every 6 (six) hours as needed for pain. 16 tablet 0    BP 159/100  Pulse 78  Temp(Src) 97.9 F (36.6 C) (Oral)  Resp 18  SpO2 98%  Physical Exam  Constitutional: He is oriented to person, place, and time. He appears well-developed and well-nourished.  HENT:  Head: Normocephalic.  Eyes: Pupils are equal, round, and reactive to light.  Neck: Normal range of motion.  Cardiovascular: Normal rate.   Pulmonary/Chest: Effort normal.  Abdominal: Soft.  Musculoskeletal: He exhibits no edema and no tenderness.       Pain is in the left buttock, left hip, radiating to the medial thigh  Neurological: He is alert and oriented to person, place, and time.  Skin: Skin is warm.    ED Course  Procedures (including critical care time)  Labs Reviewed - No data to display No results found.   No diagnosis found.    MDM  Continuation of acute onset low back pain with radiation to the left thigh.  Will write prescriptions for Percocet and Valium.  Continue with bedrest, heat, and followup with his primary care physician on Monday as  scheduled        Arman Filter, NP 06/03/11 2336

## 2011-06-03 NOTE — ED Notes (Signed)
The pt is c/o lower back pain for 1-2 weeks.  No new injury

## 2011-06-03 NOTE — Discharge Instructions (Signed)
Back Pain, Adult Low back pain is very common. About 1 in 5 people have back pain.The cause of low back pain is rarely dangerous. The pain often gets better over time.About half of people with a sudden onset of back pain feel better in just 2 weeks. About 8 in 10 people feel better by 6 weeks.  CAUSES Some common causes of back pain include:  Strain of the muscles or ligaments supporting the spine.   Wear and tear (degeneration) of the spinal discs.   Arthritis.   Direct injury to the back.  DIAGNOSIS Most of the time, the direct cause of low back pain is not known.However, back pain can be treated effectively even when the exact cause of the pain is unknown.Answering your caregiver's questions about your overall health and symptoms is one of the most accurate ways to make sure the cause of your pain is not dangerous. If your caregiver needs more information, he or she may order lab work or imaging tests (X-rays or MRIs).However, even if imaging tests show changes in your back, this usually does not require surgery. HOME CARE INSTRUCTIONS For many people, back pain returns.Since low back pain is rarely dangerous, it is often a condition that people can learn to manageon their own.   Remain active. It is stressful on the back to sit or stand in one place. Do not sit, drive, or stand in one place for more than 30 minutes at a time. Take short walks on level surfaces as soon as pain allows.Try to increase the length of time you walk each day.   Do not stay in bed.Resting more than 1 or 2 days can delay your recovery.   Do not avoid exercise or work.Your body is made to move.It is not dangerous to be active, even though your back may hurt.Your back will likely heal faster if you return to being active before your pain is gone.   Pay attention to your body when you bend and lift. Many people have less discomfortwhen lifting if they bend their knees, keep the load close to their  bodies,and avoid twisting. Often, the most comfortable positions are those that put less stress on your recovering back.   Find a comfortable position to sleep. Use a firm mattress and lie on your side with your knees slightly bent. If you lie on your back, put a pillow under your knees.   Only take over-the-counter or prescription medicines as directed by your caregiver. Over-the-counter medicines to reduce pain and inflammation are often the most helpful.Your caregiver may prescribe muscle relaxant drugs.These medicines help dull your pain so you can more quickly return to your normal activities and healthy exercise.   Put ice on the injured area.   Put ice in a plastic bag.   Place a towel between your skin and the bag.   Leave the ice on for 15 to 20 minutes, 3 to 4 times a day for the first 2 to 3 days. After that, ice and heat may be alternated to reduce pain and spasms.   Ask your caregiver about trying back exercises and gentle massage. This may be of some benefit.   Avoid feeling anxious or stressed.Stress increases muscle tension and can worsen back pain.It is important to recognize when you are anxious or stressed and learn ways to manage it.Exercise is a great option.  SEEK MEDICAL CARE IF:  You have pain that is not relieved with rest or medicine.   You have   pain that does not improve in 1 week.   You have new symptoms.   You are generally not feeling well.  SEEK IMMEDIATE MEDICAL CARE IF:   You have pain that radiates from your back into your legs.   You develop new bowel or bladder control problems.   You have unusual weakness or numbness in your arms or legs.   You develop nausea or vomiting.   You develop abdominal pain.   You feel faint.  Document Released: 03/08/2005 Document Revised: 02/25/2011 Document Reviewed: 07/27/2010 [facility] Patient Information 2012 [place], [place]. Please make sure you keep your appointment as scheduled with [facility] on Monday

## 2011-06-04 NOTE — ED Provider Notes (Signed)
Medical screening examination/treatment/procedure(s) were performed by non-physician practitioner and as supervising physician I was immediately available for consultation/collaboration.   Nat Christen, MD 06/04/11 [phone]

## 2011-06-08 ENCOUNTER — Other Ambulatory Visit: Payer: Self-pay | Admitting: Physician Assistant

## 2011-06-08 ENCOUNTER — Ambulatory Visit
Admission: RE | Admit: 2011-06-08 | Discharge: 2011-06-08 | Disposition: A | Discharge status: Home / Self Care | Payer: BC Managed Care – PPO | Source: Ambulatory Visit | Attending: Physician Assistant | Admitting: Physician Assistant

## 2011-06-08 DIAGNOSIS — M545 Low back pain: Secondary | ICD-10-CM

## 2011-08-04 ENCOUNTER — Other Ambulatory Visit: Payer: Self-pay | Admitting: Family Medicine

## 2011-08-04 ENCOUNTER — Ambulatory Visit
Admission: RE | Admit: 2011-08-04 | Discharge: 2011-08-04 | Disposition: A | Discharge status: Home / Self Care | Payer: BC Managed Care – PPO | Source: Ambulatory Visit | Attending: Family Medicine | Admitting: Family Medicine

## 2011-08-04 DIAGNOSIS — Z139 Encounter for screening, unspecified: Secondary | ICD-10-CM

## 2011-08-04 DIAGNOSIS — M545 Low back pain: Secondary | ICD-10-CM

## 2011-08-06 ENCOUNTER — Other Ambulatory Visit: Payer: BC Managed Care – PPO

## 2011-08-07 ENCOUNTER — Ambulatory Visit
Admission: RE | Admit: 2011-08-07 | Discharge: 2011-08-07 | Disposition: A | Discharge status: Home / Self Care | Payer: BC Managed Care – PPO | Source: Ambulatory Visit | Attending: Family Medicine | Admitting: Family Medicine

## 2011-08-07 DIAGNOSIS — M545 Low back pain: Secondary | ICD-10-CM

## 2011-11-06 ENCOUNTER — Emergency Department (HOSPITAL_COMMUNITY): Payer: BC Managed Care – PPO

## 2011-11-06 ENCOUNTER — Emergency Department (HOSPITAL_COMMUNITY)
Admission: EM | Admit: 2011-11-06 | Discharge: 2011-11-06 | Disposition: A | Discharge status: Home / Self Care | Payer: BC Managed Care – PPO | Attending: Emergency Medicine | Admitting: Emergency Medicine

## 2011-11-06 ENCOUNTER — Encounter (HOSPITAL_COMMUNITY): Payer: Self-pay | Admitting: *Deleted

## 2011-11-06 DIAGNOSIS — M25569 Pain in unspecified knee: Secondary | ICD-10-CM | POA: Insufficient documentation

## 2011-11-06 DIAGNOSIS — M25519 Pain in unspecified shoulder: Secondary | ICD-10-CM | POA: Insufficient documentation

## 2011-11-06 DIAGNOSIS — Z79899 Other long term (current) drug therapy: Secondary | ICD-10-CM | POA: Insufficient documentation

## 2011-11-06 DIAGNOSIS — R51 Headache: Secondary | ICD-10-CM | POA: Insufficient documentation

## 2011-11-06 DIAGNOSIS — I10 Essential (primary) hypertension: Secondary | ICD-10-CM | POA: Insufficient documentation

## 2011-11-06 DIAGNOSIS — M542 Cervicalgia: Secondary | ICD-10-CM | POA: Insufficient documentation

## 2011-11-06 DIAGNOSIS — M79609 Pain in unspecified limb: Secondary | ICD-10-CM | POA: Insufficient documentation

## 2011-11-06 MED ORDER — HYDROCODONE-ACETAMINOPHEN 5-325 MG PO TABS: 1.0000 | ORAL_TABLET | Freq: Four times a day (QID) | ORAL | Status: AC | PRN
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
## 2012-02-01 ENCOUNTER — Other Ambulatory Visit: Payer: Self-pay | Admitting: Podiatry

## 2012-04-25 ENCOUNTER — Emergency Department (HOSPITAL_COMMUNITY)
Admission: EM | Admit: 2012-04-25 | Discharge: 2012-04-25 | Disposition: A | Discharge status: Home / Self Care | Payer: BC Managed Care – PPO | Attending: Emergency Medicine | Admitting: Emergency Medicine

## 2012-04-25 ENCOUNTER — Encounter (HOSPITAL_COMMUNITY): Payer: Self-pay

## 2012-04-25 ENCOUNTER — Emergency Department (HOSPITAL_COMMUNITY): Payer: BC Managed Care – PPO

## 2012-04-25 DIAGNOSIS — M79609 Pain in unspecified limb: Secondary | ICD-10-CM | POA: Insufficient documentation

## 2012-04-25 DIAGNOSIS — I10 Essential (primary) hypertension: Secondary | ICD-10-CM | POA: Insufficient documentation

## 2012-04-25 DIAGNOSIS — M79639 Pain in unspecified forearm: Secondary | ICD-10-CM

## 2012-04-25 DIAGNOSIS — Z79899 Other long term (current) drug therapy: Secondary | ICD-10-CM | POA: Insufficient documentation

## 2012-04-25 DIAGNOSIS — M7989 Other specified soft tissue disorders: Secondary | ICD-10-CM | POA: Insufficient documentation

## 2012-04-25 HISTORY — DX: Gastro-esophageal reflux disease without esophagitis: K21.9

## 2012-04-25 HISTORY — DX: Pure hypercholesterolemia, unspecified: E78.00

## 2012-04-25 HISTORY — DX: Unspecified osteoarthritis, unspecified site: M19.90

## 2012-04-25 MED ORDER — MELOXICAM 15 MG PO TABS: 15.0000 mg | ORAL_TABLET | Freq: Every day | ORAL | Status: DC

## 2012-04-25 NOTE — ED Notes (Signed)
Ortho paged for splint 

## 2012-04-25 NOTE — ED Provider Notes (Signed)
History     CSN: [id number]  Arrival date & time 04/25/12  0612   First MD Initiated Contact with Patient 04/25/12 [phone]      Chief Complaint  Patient presents with  . Wrist Pain    (Consider location/radiation/quality/duration/timing/severity/associated sxs/prior treatment) HPI Comments: Patient presents with complaints of left forearm pain and swelling for the past week and a half. Patient has noted swelling and worsening pain, worsening acutely yesterday. No acute injuries however patient does a lot of heavy lifting at work. No fevers or redness noted. No treatments prior to arrival other than ice. Patient has orthopedist. Patient denies elbow or shoulder pain. No neck pain. Onset of symptoms gradual. Course is constant. Nothing makes symptoms better. Movement and palpation makes the pain worse.  The history is provided by the patient.    Past Medical History  Diagnosis Date  . Hypertension     Past Surgical History  Procedure Date  . Knee surgery     No family history on file.  History  Substance Use Topics  . Smoking status: Never Smoker   . Smokeless tobacco: Not on file  . Alcohol Use: Yes      Review of Systems  Constitutional: Negative for fever.  Gastrointestinal: Negative for nausea and vomiting.  Musculoskeletal: Positive for myalgias and arthralgias.    Allergies  Review of patient's allergies indicates no known allergies.  Home Medications   Current Outpatient Rx  Name  Route  Sig  Dispense  Refill  . BENAZEPRIL HCL 5 MG PO TABS   Oral   Take 5 mg by mouth daily.         Marland Kitchen GABAPENTIN 300 MG PO CAPS   Oral   Take 300 mg by mouth 3 (three) times daily.         . IBUPROFEN 200 MG PO TABS   Oral   Take 800 mg by mouth every 6 (six) hours as needed. For pain         . OMEPRAZOLE 20 MG PO CPDR   Oral   Take 20 mg by mouth daily.           BP 153/85  Pulse 80  Temp 97.7 F (36.5 C) (Oral)  Resp 18  SpO2 100%  Physical Exam    Nursing note and vitals reviewed. Constitutional: He appears well-developed and well-nourished.  HENT:  Head: Normocephalic and atraumatic.  Eyes: Conjunctivae normal are normal.  Neck: Normal range of motion. Neck supple.  Pulmonary/Chest: No respiratory distress.  Musculoskeletal:       Left shoulder: Normal.       Left elbow: Normal.       Left wrist: Normal.       Left forearm: He exhibits tenderness and swelling.       Arms:      Left hand: He exhibits normal range of motion, no tenderness, normal capillary refill and no swelling. Decreased sensation is not present in the ulnar distribution, is not present in the medial redistribution and is not present in the radial distribution. He exhibits no finger abduction, no thumb/finger opposition and no wrist extension trouble.  Neurological: He is alert.  Skin: Skin is warm and dry.  Psychiatric: He has a normal mood and affect.    ED Course  Procedures (including critical care time)  Labs Reviewed - No data to display Dg Forearm Left  04/25/2012  *RADIOLOGY REPORT*  Clinical Data: Left arm swelling  LEFT FOREARM - 2 VIEW  Comparison: None.  Findings: No fracture dislocation of the left radius or ulna.  No radiodense foreign body.  No obvious soft tissue abnormality.  IMPRESSION: No acute osseous abnormality.   Original Report Authenticated By: Genevive Bi, M.D.      1. Forearm pain     6:35 AM Patient seen and examined. Work-up initiated.   Vital signs reviewed and are as follows: Filed Vitals:   04/25/12 0621  BP: 153/85  Pulse: 80  Temp: 97.7 F (36.5 C)  Resp: 18   Dr. Madelon Lips rx #60 hydrocodone on 04/13/12 per substance reporting database.    7:18 AM X-ray reviewed by myself.   Patient informed of results. He will followup with his orthopedic doctor. Wrist splint by orthopedic tech. Anti-inflammatory medicines given.  Patient was counseled on RICE protocol and told to rest injury, use ice for no longer than 15  minutes every hour, compress the area, and elevate above the level of their heart as much as possible to reduce swelling.  Questions answered.  Patient verbalized understanding.      MDM  Forearm pain and swelling, likely sequelae of recent resumption of heavy lifting/overuse. Sling, RICE, NAIDs, ortho f/u indicated.         [place], [place] 04/25/12 [phone]

## 2012-04-25 NOTE — Progress Notes (Signed)
Orthopedic Tech Progress Note Patient Details:  Vincent Summers Feb 28, 1961 [id number]  Ortho Devices Type of Ortho Device: Velcro wrist splint Ortho Device/Splint Location: LEFT WRIST SPLINT Ortho Device/Splint Interventions: Application   Cammer, Mickie Bail 04/25/2012, 7:38 AM

## 2012-04-25 NOTE — ED Notes (Signed)
The patient states that his wrist has been mildly tender for "a few weeks."  Yesterday, his pain got much worse.  He denies any known trauma, but he does state that he does a lot of heavy lifting at his job.

## 2012-04-25 NOTE — ED Notes (Addendum)
Can wiggle digits, skin warm and dry, cap refill less than 3 seconds, Sensation present, Skin pink. Swelling noted

## 2012-04-28 NOTE — ED Provider Notes (Signed)
Medical screening examination/treatment/procedure(s) were performed by non-physician practitioner and as supervising physician I was immediately available for consultation/collaboration.  Jasmine Awe, MD 04/28/12 [phone]

## 2012-06-04 ENCOUNTER — Encounter (HOSPITAL_COMMUNITY): Payer: Self-pay

## 2012-06-04 ENCOUNTER — Emergency Department (HOSPITAL_COMMUNITY)
Admission: EM | Admit: 2012-06-04 | Discharge: 2012-06-04 | Disposition: A | Discharge status: Home / Self Care | Payer: BC Managed Care – PPO | Source: Home / Self Care | Attending: Emergency Medicine | Admitting: Emergency Medicine

## 2012-06-04 NOTE — ED Notes (Signed)
Patient left before being seen, states he could not wait, he had to go pick his children up

## 2012-06-04 NOTE — ED Notes (Addendum)
Patient states that he was working in the garage and thinks he may have pulled a muscle, woke today with back pain and states that it was hard for him to get out of bed this morning

## 2012-06-04 NOTE — ED Notes (Signed)
Patient made aware his provider still had 2 to discharge ahead of him and she would be in as soon as possible.  Patient states he needs to pick up his children but would waiting a little longer

## 2012-06-11 ENCOUNTER — Encounter (HOSPITAL_COMMUNITY): Payer: Self-pay | Admitting: *Deleted

## 2012-06-11 DIAGNOSIS — Z8639 Personal history of other endocrine, nutritional and metabolic disease: Secondary | ICD-10-CM | POA: Insufficient documentation

## 2012-06-11 DIAGNOSIS — R110 Nausea: Secondary | ICD-10-CM | POA: Insufficient documentation

## 2012-06-11 DIAGNOSIS — Z79899 Other long term (current) drug therapy: Secondary | ICD-10-CM | POA: Insufficient documentation

## 2012-06-11 DIAGNOSIS — R1013 Epigastric pain: Secondary | ICD-10-CM | POA: Insufficient documentation

## 2012-06-11 DIAGNOSIS — K3189 Other diseases of stomach and duodenum: Secondary | ICD-10-CM | POA: Insufficient documentation

## 2012-06-11 DIAGNOSIS — I10 Essential (primary) hypertension: Secondary | ICD-10-CM | POA: Insufficient documentation

## 2012-06-11 DIAGNOSIS — Z9861 Coronary angioplasty status: Secondary | ICD-10-CM | POA: Insufficient documentation

## 2012-06-11 DIAGNOSIS — M129 Arthropathy, unspecified: Secondary | ICD-10-CM | POA: Insufficient documentation

## 2012-06-11 DIAGNOSIS — K219 Gastro-esophageal reflux disease without esophagitis: Secondary | ICD-10-CM | POA: Insufficient documentation

## 2012-06-11 DIAGNOSIS — Z862 Personal history of diseases of the blood and blood-forming organs and certain disorders involving the immune mechanism: Secondary | ICD-10-CM | POA: Insufficient documentation

## 2012-06-11 DIAGNOSIS — Z9089 Acquired absence of other organs: Secondary | ICD-10-CM | POA: Insufficient documentation

## 2012-06-11 DIAGNOSIS — R195 Other fecal abnormalities: Secondary | ICD-10-CM | POA: Insufficient documentation

## 2012-06-11 LAB — CBC WITH DIFFERENTIAL/PLATELET
Basophils Relative: 1 % (ref 0–1)
Hemoglobin: 15.6 g/dL (ref 13.0–17.0)
Lymphocytes Relative: 32 % (ref 12–46)
Lymphs Abs: 2.7 10*3/uL (ref 0.7–4.0)
MCHC: 35.7 g/dL (ref 30.0–36.0)
Monocytes Relative: 10 % (ref 3–12)
Neutro Abs: 4.7 10*3/uL (ref 1.7–7.7)
Neutrophils Relative %: 55 % (ref 43–77)
RBC: 4.93 MIL/uL (ref 4.22–5.81)
WBC: 8.6 10*3/uL (ref 4.0–10.5)

## 2012-06-11 LAB — COMPREHENSIVE METABOLIC PANEL
Albumin: 4.4 g/dL (ref 3.5–5.2)
Alkaline Phosphatase: 124 U/L — ABNORMAL HIGH (ref 39–117)
BUN: 14 mg/dL (ref 6–23)
CO2: 27 mEq/L (ref 19–32)
Chloride: 99 mEq/L (ref 96–112)
Potassium: 4.5 mEq/L (ref 3.5–5.1)
Total Bilirubin: 0.2 mg/dL — ABNORMAL LOW (ref 0.3–1.2)

## 2012-06-11 NOTE — ED Notes (Signed)
Pt states abdominal pain for the past 2-3 weeks. Pt states that he is also having tarry stools/ dark stools. Pt unsure how long that has been going on for. Pt denies N/V, pt able to eat and drink, but after feels sharp shooting pains in his left side abdominal area. Pt states "feels like a hamster running around on a wheel."

## 2012-06-12 ENCOUNTER — Emergency Department (HOSPITAL_COMMUNITY)
Admission: EM | Admit: 2012-06-12 | Discharge: 2012-06-12 | Disposition: A | Discharge status: Home / Self Care | Payer: BC Managed Care – PPO | Attending: Emergency Medicine | Admitting: Emergency Medicine

## 2012-06-12 DIAGNOSIS — K219 Gastro-esophageal reflux disease without esophagitis: Secondary | ICD-10-CM

## 2012-06-12 DIAGNOSIS — R1013 Epigastric pain: Secondary | ICD-10-CM

## 2012-06-12 DIAGNOSIS — R109 Unspecified abdominal pain: Secondary | ICD-10-CM

## 2012-06-12 LAB — TYPE AND SCREEN: ABO/RH(D): O POS

## 2012-06-12 LAB — OCCULT BLOOD, POC DEVICE: Fecal Occult Bld: NEGATIVE

## 2012-06-12 MED ORDER — TRAMADOL HCL 50 MG PO TABS: 50.0000 mg | ORAL_TABLET | Freq: Four times a day (QID) | ORAL | Status: DC | PRN

## 2012-06-12 MED ORDER — OMEPRAZOLE 20 MG PO CPDR: 40.0000 mg | DELAYED_RELEASE_CAPSULE | Freq: Two times a day (BID) | ORAL | Status: DC

## 2012-06-12 NOTE — ED Notes (Signed)
Patient reports LLQ abdominal pain, nausea and dark stools x 1 week. No CP, SOB. No diarrhea or constipation. LBM 06/11/12. Reports no changes in appetite; eating and drinking ok. Takes about ibuprofen 800 mg BID. Consumes 1-2 beers daily. Denies nicotine use.

## 2012-06-12 NOTE — ED Provider Notes (Signed)
History     CSN: [id number]  Arrival date & time 06/11/12  2212   First MD Initiated Contact with Patient 06/12/12 0040      Chief Complaint  Patient presents with  . Abdominal Pain    (Consider location/radiation/quality/duration/timing/severity/associated sxs/prior treatment) HPI Vincent Summers is a 52 y.o. male with prior normal cardiac catheterization in 2007, with a history of hypertension, hypercholesterolemia and family history pertinent for coronary artery disease presents with 2-3 weeks of upper abdominal/epigastric pain. He says is also seen occasional dark tarry stools. He says he usually has nausea with the pain, and this is usually after eating. After eating, he gets sharp shooting pains in the left upper quadrant. He then also describes a "hamster wheel" sensation in the left upper quadrant that comes and goes. Although the symptoms are intermittent. Patient says occasionally Rolaids do help his pain. No associated radiation, no associated shortness of breath, vomiting, diaphoresis. No recent illnesses, productive cough, diarrhea. Patient is a beer drinker 2-3 per day.   Past Medical History  Diagnosis Date  . Hypertension   . GERD (gastroesophageal reflux disease)   . Arthritis   . Hypercholesteremia     Past Surgical History  Procedure Laterality Date  . Knee surgery    . Appendectomy    . Tonsillectomy    . Shoulder surgery      Family History  Problem Relation Age of Onset  . Emphysema Mother   . Skin cancer Mother   . Leukemia Father   . Heart disease Father   . Arrhythmia Father     History  Substance Use Topics  . Smoking status: Never Smoker   . Smokeless tobacco: Never Used  . Alcohol Use: 8.4 oz/week    14 Cans of beer per week      Review of Systems At least 10pt or greater review of systems completed and are negative except where specified in the HPI.  Allergies  Review of patient's allergies indicates no known allergies.  Home  Medications   Current Outpatient Rx  Name  Route  Sig  Dispense  Refill  . benazepril (LOTENSIN) 5 MG tablet   Oral   Take 5 mg by mouth daily.         . Ca Carbonate-Mag Hydroxide (ROLAIDS) 550-110 MG CHEW   Oral   Chew 1 tablet by mouth every 2 (two) hours as needed (heart burn).         . minocycline (MINOCIN,DYNACIN) 100 MG capsule   Oral   Take 100 mg by mouth 2 (two) times daily.         Marland Kitchen omeprazole (PRILOSEC) 20 MG capsule   Oral   Take 2 capsules (40 mg total) by mouth 2 (two) times daily.   60 capsule   0   . traMADol (ULTRAM) 50 MG tablet   Oral   Take 1-2 tablets (50-100 mg total) by mouth every 6 (six) hours as needed for pain.   15 tablet   0     BP 142/82  Pulse 73  Temp(Src) 97.9 F (36.6 C) (Oral)  Resp 16  SpO2 97%  Physical Exam  Nursing notes reviewed.  Electronic medical record reviewed. VITAL SIGNS:   Filed Vitals:   06/12/12 0130 06/12/12 0152 06/12/12 0200 06/12/12 0215  BP: 136/80 138/86 133/75 142/82  Pulse: 69 72 73 73  Temp:      TempSrc:      Resp:  16    SpO2:  99% 98% 98% 97%   CONSTITUTIONAL: Awake, oriented, appears non-toxic HENT: Atraumatic, normocephalic, oral mucosa pink and moist, airway patent. Nares patent without drainage. External ears normal. EYES: Conjunctiva clear, EOMI, PERRLA NECK: Trachea midline, non-tender, supple CARDIOVASCULAR: Normal heart rate, Normal rhythm, No murmurs, rubs, gallops PULMONARY/CHEST: Clear to auscultation, no rhonchi, wheezes, or rales. Symmetrical breath sounds. Non-tender. ABDOMINAL: Non-distended, soft, non-tender - no rebound or guarding.  BS normal. NEUROLOGIC: Non-focal, moving all four extremities, no gross sensory or motor deficits. RECTAL: Normal sphincter tone, no gross blood, prostate normal size without nodules or tenderness. EXTREMITIES: No clubbing, cyanosis, or edema SKIN: Warm, Dry, No erythema, No rash  ED Course  Procedures (including critical care time)   Date: 06/12/2012  Rate: 61  Rhythm: normal sinus rhythm  QRS Axis: normal  Intervals: normal  ST/T Wave abnormalities: normal  Conduction Disutrbances: none  Narrative Interpretation: unremarkable   Labs Reviewed  COMPREHENSIVE METABOLIC PANEL - Abnormal; Notable for the following:    Glucose, Bld 100 (*)    Alkaline Phosphatase 124 (*)    Total Bilirubin 0.2 (*)    All other components within normal limits  CBC WITH DIFFERENTIAL  OCCULT BLOOD, POC DEVICE  TYPE AND SCREEN  ABO/RH   No results found.   1. Abdominal pain   2. GERD (gastroesophageal reflux disease)   3. Dyspepsia       MDM  Vincent Summers is a 52 y.o. male presents with epigastric pain.  Patient was triaged with concern for GI bleed - rectal exam shows no gross blood, no melena-fecal occult blood is negative. Brown stool in the vault. Patient does drink beer, he has a history of GERD, I think this is likely his problem, likely is having exacerbation of her gastroesophageal reflux versus peptic ulcer disease. Patient currently takes omeprazole, will increase to 40 mg twice daily. Also discouraged against using nonsteroidal anti-inflammatories, drinking beer.  I do not think this patient's pain is related to acute coronary syndrome. Patient's EKG is unremarkable, his troponin is negative. Labs are unremarkable, he is not anemic.  Patient's abdominal exam is benign, nontender-nonacute, I do not suspect a ruptured viscus.  Outpatient followup at the end of the week to assess therapy.  I explained the diagnosis and have given explicit precautions to return to the ER including frequent bloody stools, frequent black stools, symptoms of anemia, chest pain, lightheadedness, syncope, shortness of breath or any other new or worsening symptoms. The patient understands and accepts the medical plan as it's been dictated and I have answered their questions. Discharge instructions concerning home care and prescriptions have been  given.  The patient is STABLE and is discharged to home in good condition.          Jones Skene, MD 06/13/12 1219

## 2012-06-12 NOTE — ED Notes (Signed)
MD at bedside. 

## 2012-06-20 ENCOUNTER — Encounter: Payer: Self-pay | Admitting: Family Medicine

## 2012-06-20 ENCOUNTER — Ambulatory Visit (INDEPENDENT_AMBULATORY_CARE_PROVIDER_SITE_OTHER): Payer: Medicaid Other | Admitting: Family Medicine

## 2012-06-20 VITALS — BP 110/78 | HR 82 | Temp 98.2°F | Resp 18 | Wt 245.0 lb

## 2012-06-20 DIAGNOSIS — Z1211 Encounter for screening for malignant neoplasm of colon: Secondary | ICD-10-CM

## 2012-06-20 DIAGNOSIS — M25569 Pain in unspecified knee: Secondary | ICD-10-CM

## 2012-06-20 DIAGNOSIS — K219 Gastro-esophageal reflux disease without esophagitis: Secondary | ICD-10-CM

## 2012-06-20 DIAGNOSIS — I10 Essential (primary) hypertension: Secondary | ICD-10-CM | POA: Insufficient documentation

## 2012-06-20 DIAGNOSIS — F419 Anxiety disorder, unspecified: Secondary | ICD-10-CM | POA: Insufficient documentation

## 2012-06-20 DIAGNOSIS — D239 Other benign neoplasm of skin, unspecified: Secondary | ICD-10-CM

## 2012-06-20 DIAGNOSIS — M1990 Unspecified osteoarthritis, unspecified site: Secondary | ICD-10-CM | POA: Insufficient documentation

## 2012-06-20 DIAGNOSIS — E7800 Pure hypercholesterolemia, unspecified: Secondary | ICD-10-CM | POA: Insufficient documentation

## 2012-06-20 DIAGNOSIS — D229 Melanocytic nevi, unspecified: Secondary | ICD-10-CM

## 2012-06-20 MED ORDER — OMEPRAZOLE 20 MG PO CPDR: 40.0000 mg | DELAYED_RELEASE_CAPSULE | Freq: Two times a day (BID) | ORAL | Status: DC

## 2012-06-20 MED ORDER — TRAMADOL HCL 50 MG PO TABS: 50.0000 mg | ORAL_TABLET | Freq: Four times a day (QID) | ORAL | Status: DC | PRN

## 2012-06-20 NOTE — Progress Notes (Signed)
Subjective:     Patient ID: Vincent Summers, male   DOB: December 10, 1960, 52 y.o.   MRN: [id number]  HPI Patient is here for an ER followup.  The last several months he's been taking ibuprofen 800 mg 3 times a day.  He does this for his bilateral knee pain. He has moderate to severe osteoarthritis. His orthopedist has recommended a knee replacement however they have been trying to stall for as much time as possible.  Recently he developed an achy crampy pain in his left upper quadrant. This became intense and constant it emergency room. The pain is worse with food. He also reported possibly melanotic stools.  It emergency room he was told to discontinue ibuprofen. He was given Prilosec 40 mg by mouth twice a day.  Over the last week it started to markedly improve on the prosthetic. He denies any further melanotic stools.  However he's using tramadol twice a day for knee pain. Past Medical History  Diagnosis Date  . Arthritis   . GERD (gastroesophageal reflux disease)   . Hypercholesteremia   . Hypertension   . Anxiety    Current Outpatient Prescriptions on File Prior to Visit  Medication Sig Dispense Refill  . benazepril (LOTENSIN) 5 MG tablet Take 5 mg by mouth daily.      . Ca Carbonate-Mag Hydroxide (ROLAIDS) 550-110 MG CHEW Chew 1 tablet by mouth every 2 (two) hours as needed (heart burn).      . minocycline (MINOCIN,DYNACIN) 100 MG capsule Take 100 mg by mouth 2 (two) times daily.       No current facility-administered medications on file prior to visit.   History   Social History  . Marital Status: Married    Spouse Name: N/A    Number of Children: N/A  . Years of Education: N/A   Occupational History  . Not on file.   Social History Main Topics  . Smoking status: Never Smoker   . Smokeless tobacco: Never Used  . Alcohol Use: 8.4 oz/week    14 Cans of beer per week  . Drug Use: No  . Sexually Active: Yes   Other Topics Concern  . Not on file   Social History Narrative  . No  narrative on file     Review of Systems Review of systems is otherwise negative except that dictated in the history of present illness    Objective:   Physical Exam  Constitutional: He appears well-developed and well-nourished.  HENT:  Head: Normocephalic.  Right Ear: External ear normal.  Left Ear: External ear normal.  Neck: Normal range of motion. Neck supple. No thyromegaly present.  Cardiovascular: Normal rate, regular rhythm and normal heart sounds.   No murmur heard. Pulmonary/Chest: Effort normal and breath sounds normal. No respiratory distress. He has no wheezes. He has no rales.  Abdominal: Soft. Bowel sounds are normal. He exhibits no distension. There is no tenderness. There is no rebound.   he has one 2 mm dark black ball on his left forearm. There's a 1-2 mm black mole near that. He also has several dark moles on his neck and upper posterior shoulder.     Assessment:     Atypical melanocytic nevus GERD Knee pain    Plan:     1. GERD (gastroesophageal reflux disease) - omeprazole (PRILOSEC) 20 MG capsule; Take 2 capsules (40 mg total) by mouth 2 (two) times daily.  Dispense: 120 capsule; Refill: 2 He is to continue this for 2 months.  If symptoms have completely subsided at that point we will go back to 20 mg once a day. He is due for colonoscopy and I will set that up. 2. Knee pain, unspecified laterality - traMADol (ULTRAM) 50 MG tablet; Take 1-2 tablets (50-100 mg total) by mouth every 6 (six) hours as needed for pain.  Dispense: 60 tablet; Refill: 0   3. Atypical mole His family history is significant for melanoma and mother and his sister. Will consult dermatology for biopsy. - Ambulatory referral to Dermatology

## 2012-06-20 NOTE — Addendum Note (Signed)
Addended by: Lynnea Ferrier T on: 06/20/2012 09:02 AM   Modules accepted: Orders

## 2012-07-10 ENCOUNTER — Other Ambulatory Visit: Payer: Self-pay | Admitting: Family Medicine

## 2012-07-10 NOTE — Telephone Encounter (Signed)
?   OK to Refill  

## 2012-07-10 NOTE — Telephone Encounter (Signed)
Ok to refill #60 

## 2012-07-10 NOTE — Telephone Encounter (Signed)
rf per protocol °

## 2012-07-21 ENCOUNTER — Encounter: Payer: Self-pay | Admitting: Family Medicine

## 2012-07-21 ENCOUNTER — Ambulatory Visit (INDEPENDENT_AMBULATORY_CARE_PROVIDER_SITE_OTHER): Payer: Medicaid Other | Admitting: Family Medicine

## 2012-07-21 VITALS — BP 120/80 | HR 74 | Temp 98.0°F | Resp 16 | Wt 236.0 lb

## 2012-07-21 DIAGNOSIS — S058X9A Other injuries of unspecified eye and orbit, initial encounter: Secondary | ICD-10-CM

## 2012-07-21 DIAGNOSIS — S0501XA Injury of conjunctiva and corneal abrasion without foreign body, right eye, initial encounter: Secondary | ICD-10-CM

## 2012-07-21 MED ORDER — POLYMYXIN B-TRIMETHOPRIM 10000-0.1 UNIT/ML-% OP SOLN: 2.0000 [drp] | OPHTHALMIC | Status: AC

## 2012-07-21 NOTE — Progress Notes (Signed)
  Subjective:    Patient ID: Vincent Summers, male    DOB: 05-01-1960, 52 y.o.   MRN: [id number]  HPI  Last night at work, the patient developed a foreign body sensation in his eye. Then the night his eye is watering. This morning he woke up with his right eye matted closed. He has some mild right conjunctival injection and erythema. He denies any blurred vision. He denies any high pain. He denies any pain with extraocular movement. He denies the sensation of foreign body in his eye today. Past Medical History  Diagnosis Date  . Arthritis   . GERD (gastroesophageal reflux disease)   . Hypercholesteremia   . Hypertension   . Anxiety    Current Outpatient Prescriptions on File Prior to Visit  Medication Sig Dispense Refill  . benazepril (LOTENSIN) 5 MG tablet Take 5 mg by mouth daily.      . Ca Carbonate-Mag Hydroxide (ROLAIDS) 550-110 MG CHEW Chew 1 tablet by mouth every 2 (two) hours as needed (heart burn).      . minocycline (MINOCIN,DYNACIN) 100 MG capsule Take 100 mg by mouth 2 (two) times daily.      Marland Kitchen omeprazole (PRILOSEC) 20 MG capsule Take 2 capsules (40 mg total) by mouth 2 (two) times daily.  120 capsule  2  . traMADol (ULTRAM) 50 MG tablet TAKE 1 TO 2 TABLETS BY MOUTH EVERY 6 HOURS AS NEEDED FOR PAIN  60 tablet  0   No current facility-administered medications on file prior to visit.   No Known Allergies   Review of Systems  All other systems reviewed and are negative.       Objective:   Physical Exam  Constitutional: He appears well-developed and well-nourished.  Eyes: EOM and lids are normal. Pupils are equal, round, and reactive to light. No foreign bodies found. Right eye exhibits no exudate and no hordeolum. No foreign body present in the right eye. Right conjunctiva is injected.  Cardiovascular: Normal rate and regular rhythm.   Pulmonary/Chest: Effort normal and breath sounds normal.   there is no definite evidence of corneal abrasion on fluorescein  exam.         Assessment & Plan:  1. Corneal abrasion, right, initial encounter This is either a mild corneal abrasion versus an early conjunctivitis. Therefore I will treat with topical eyedrops. Polytrim 2 drops every 4 hours in the right eye for the next 7 days. He is to followup next week if it is no better sooner if is worse. - trimethoprim-polymyxin b (POLYTRIM) ophthalmic solution; Place 2 drops into the right eye every 4 (four) hours.  Dispense: 10 mL; Refill: 0

## 2012-08-08 ENCOUNTER — Other Ambulatory Visit: Payer: Self-pay | Admitting: Family Medicine

## 2012-08-08 NOTE — Telephone Encounter (Signed)
Ok to refill 

## 2012-08-08 NOTE — Telephone Encounter (Signed)
Medication refilled per protocol. 

## 2012-08-08 NOTE — Telephone Encounter (Signed)
Okay to refill? 

## 2012-08-24 ENCOUNTER — Encounter: Payer: Self-pay | Admitting: Family Medicine

## 2012-09-08 ENCOUNTER — Other Ambulatory Visit: Payer: Self-pay | Admitting: Family Medicine

## 2012-09-08 MED ORDER — TRAMADOL HCL 50 MG PO TABS: ORAL_TABLET | ORAL | Status: DC

## 2012-09-08 NOTE — Telephone Encounter (Signed)
Ok to refill 

## 2012-09-08 NOTE — Telephone Encounter (Signed)
Med called out 

## 2012-09-08 NOTE — Addendum Note (Signed)
Addended by: Elvina Mattes T on: 09/08/2012 04:20 PM   Modules accepted: Orders

## 2012-09-08 NOTE — Telephone Encounter (Signed)
?  ok to refill °

## 2012-10-06 ENCOUNTER — Other Ambulatory Visit: Payer: Self-pay | Admitting: Family Medicine

## 2012-10-06 NOTE — Telephone Encounter (Signed)
Ok to refill 

## 2012-10-06 NOTE — Telephone Encounter (Signed)
Med phoned in °

## 2012-12-08 ENCOUNTER — Other Ambulatory Visit: Payer: Self-pay | Admitting: Family Medicine

## 2012-12-08 NOTE — Telephone Encounter (Signed)
ok 

## 2012-12-08 NOTE — Telephone Encounter (Signed)
?   OK to Refill  

## 2013-01-05 ENCOUNTER — Other Ambulatory Visit: Payer: Self-pay | Admitting: Family Medicine

## 2013-01-05 ENCOUNTER — Encounter: Payer: Self-pay | Admitting: Family Medicine

## 2013-01-05 NOTE — Telephone Encounter (Signed)
Medication refill for one time only.  Patient needs to be seen.  Letter sent for patient to call and schedule 

## 2013-01-12 ENCOUNTER — Other Ambulatory Visit: Payer: Self-pay | Admitting: Family Medicine

## 2013-01-12 NOTE — Telephone Encounter (Signed)
Rx called in 

## 2013-01-12 NOTE — Telephone Encounter (Signed)
Ok to refill 

## 2013-01-12 NOTE — Telephone Encounter (Signed)
ok 

## 2013-01-23 ENCOUNTER — Other Ambulatory Visit: Payer: Self-pay | Admitting: Family Medicine

## 2013-01-25 ENCOUNTER — Other Ambulatory Visit: Payer: Self-pay

## 2013-02-09 ENCOUNTER — Other Ambulatory Visit: Payer: Self-pay | Admitting: Family Medicine

## 2013-02-09 NOTE — Telephone Encounter (Signed)
?   OK to Refill  

## 2013-02-09 NOTE — Telephone Encounter (Signed)
ok 

## 2013-02-22 ENCOUNTER — Other Ambulatory Visit: Payer: Self-pay | Admitting: Family Medicine

## 2013-03-09 ENCOUNTER — Other Ambulatory Visit: Payer: Self-pay | Admitting: Family Medicine

## 2013-03-12 NOTE — Telephone Encounter (Signed)
?   OK to Refill  

## 2013-03-12 NOTE — Telephone Encounter (Signed)
ok 

## 2013-03-26 ENCOUNTER — Other Ambulatory Visit: Payer: Self-pay | Admitting: Family Medicine

## 2013-03-26 NOTE — Telephone Encounter (Signed)
.?   OK to Refill - we have not seen him for this nor rx'd this for him

## 2013-03-27 NOTE — Telephone Encounter (Signed)
Denied, I do not know what he is using this for?

## 2013-04-09 ENCOUNTER — Telehealth: Payer: Self-pay | Admitting: Family Medicine

## 2013-04-09 MED ORDER — TRAMADOL HCL 50 MG PO TABS: ORAL_TABLET | ORAL | Status: DC

## 2013-04-09 NOTE — Telephone Encounter (Signed)
ok 

## 2013-04-09 NOTE — Telephone Encounter (Signed)
Requesting refill on tramadol. .? OK to Refill

## 2013-04-09 NOTE — Telephone Encounter (Signed)
Rx Refilled  

## 2013-04-10 ENCOUNTER — Telehealth: Payer: Self-pay | Admitting: Family Medicine

## 2013-04-10 ENCOUNTER — Encounter: Payer: Self-pay | Admitting: Family Medicine

## 2013-04-10 ENCOUNTER — Ambulatory Visit (INDEPENDENT_AMBULATORY_CARE_PROVIDER_SITE_OTHER): Payer: Medicaid Other | Admitting: Family Medicine

## 2013-04-10 VITALS — BP 142/96 | HR 76 | Temp 97.1°F | Resp 18 | Wt 241.0 lb

## 2013-04-10 DIAGNOSIS — M129 Arthropathy, unspecified: Secondary | ICD-10-CM

## 2013-04-10 DIAGNOSIS — I10 Essential (primary) hypertension: Secondary | ICD-10-CM

## 2013-04-10 DIAGNOSIS — M1990 Unspecified osteoarthritis, unspecified site: Secondary | ICD-10-CM

## 2013-04-10 MED ORDER — BENAZEPRIL HCL 20 MG PO TABS: ORAL_TABLET | ORAL | Status: DC

## 2013-04-10 MED ORDER — MINOCYCLINE HCL 100 MG PO CAPS: 100.0000 mg | ORAL_CAPSULE | Freq: Two times a day (BID) | ORAL | Status: DC

## 2013-04-10 NOTE — Telephone Encounter (Signed)
Request Rf Tramadol 50 mg 1-2 Q6hr prn  Last RF yesterday?  Pharmacy called, they did not have.  Verbal order given again.

## 2013-04-10 NOTE — Progress Notes (Signed)
Subjective:    Patient ID: Vincent Summers, male    DOB: 07-Oct-1960, 53 y.o.   MRN: [id number]  HPI Patient has a history of hypertension. He is currently taking benazepril 10 mg by mouth daily. His blood pressure is elevated 142/96. He denies any chest pain, shortness of breath, dyspnea on exertion. He is also taking tramadol 50 mg, 2 tablets a day. He takes it mainly at night after he finishes working his shift. His job requires a lot of walking on concrete. He has significant arthritis in his left knee. He has had 5 knee surgeries. He has torn his anterior cruciate ligament.   This all this together to create significant amount of discomfort and pain in his knee. Taking the tramadol at night helps him sleep. There has been no evidence of abuse or diversion of the medication. He also takes minocycline 100 mg by mouth twice a day for rosacea. This controls his rosacea well. He has tried MetroGel in the past with no success. He requests a refill on minocycline. Past Medical History  Diagnosis Date  . Arthritis   . GERD (gastroesophageal reflux disease)   . Hypercholesteremia   . Hypertension   . Anxiety    Current Outpatient Prescriptions on File Prior to Visit  Medication Sig Dispense Refill  . Ca Carbonate-Mag Hydroxide (ROLAIDS) 550-110 MG CHEW Chew 1 tablet by mouth every 2 (two) hours as needed (heart burn).      Marland Kitchen omeprazole (PRILOSEC) 20 MG capsule TAKE 2 CAPSULES BY MOUTH 2 TIMES A DAY  60 capsule  11  . traMADol (ULTRAM) 50 MG tablet TAKE 1 TO 2 TABLETS BY MOUTH EVERY 6 HOURS AS NEEDED FOR PAIN  60 tablet  0   No current facility-administered medications on file prior to visit.   No Known Allergies History   Social History  . Marital Status: Married    Spouse Name: N/A    Number of Children: N/A  . Years of Education: N/A   Occupational History  . Not on file.   Social History Main Topics  . Smoking status: Never Smoker   . Smokeless tobacco: Never Used  . Alcohol Use:  8.4 oz/week    14 Cans of beer per week  . Drug Use: No  . Sexual Activity: Yes   Other Topics Concern  . Not on file   Social History Narrative  . No narrative on file      Review of Systems  All other systems reviewed and are negative.       Objective:   Physical Exam  Vitals reviewed. Constitutional: He appears well-developed and well-nourished. No distress.  Neck: No JVD present. No thyromegaly present.  Cardiovascular: Normal rate, regular rhythm and normal heart sounds.  Exam reveals no gallop and no friction rub.   No murmur heard. Pulmonary/Chest: Effort normal and breath sounds normal. No respiratory distress. He has no wheezes. He has no rales.  Abdominal: Soft. Bowel sounds are normal. He exhibits no distension. There is no tenderness. There is no rebound and no guarding.  Musculoskeletal: He exhibits no edema.  Lymphadenopathy:    He has no cervical adenopathy.  Skin: He is not diaphoretic.          Assessment & Plan:  1. HTN (hypertension) Increased benazepril 20 mg a day. Return fasting for a CMP and fasting lipid panel. LDL is less than 130. - COMPLETE METABOLIC PANEL WITH GFR - Lipid panel  2. Arthritis Continue tramadol  50 mg tablets one to 2 tablets by mouth daily, 60 tablets per month.

## 2013-05-15 ENCOUNTER — Telehealth: Payer: Self-pay | Admitting: Family Medicine

## 2013-05-15 MED ORDER — TRAMADOL HCL 50 MG PO TABS: ORAL_TABLET | ORAL | Status: DC

## 2013-05-15 NOTE — Telephone Encounter (Signed)
ok 

## 2013-05-15 NOTE — Telephone Encounter (Signed)
Requesting a refill on Tramadol - ? OK to Refill  

## 2013-05-15 NOTE — Telephone Encounter (Signed)
Rx Refilled  

## 2013-06-11 ENCOUNTER — Telehealth: Payer: Self-pay | Admitting: Family Medicine

## 2013-06-11 NOTE — Telephone Encounter (Signed)
Requesting refill on Tramadol - ? OK to Refill  

## 2013-06-12 MED ORDER — TRAMADOL HCL 50 MG PO TABS: ORAL_TABLET | ORAL | Status: DC

## 2013-06-12 NOTE — Telephone Encounter (Signed)
Med call out to pharm

## 2013-06-12 NOTE — Telephone Encounter (Signed)
ok 

## 2013-07-11 ENCOUNTER — Telehealth: Payer: Self-pay | Admitting: Family Medicine

## 2013-07-11 NOTE — Telephone Encounter (Signed)
Requesting refill on Tramadol - ? OK to Refill and to do 3 months??

## 2013-07-12 MED ORDER — TRAMADOL HCL 50 MG PO TABS: ORAL_TABLET | ORAL | Status: DC

## 2013-07-12 NOTE — Telephone Encounter (Signed)
ok 

## 2013-07-12 NOTE — Telephone Encounter (Signed)
Medication called to pharmacy. 

## 2013-10-07 ENCOUNTER — Other Ambulatory Visit: Payer: Self-pay | Admitting: Family Medicine

## 2013-10-23 ENCOUNTER — Telehealth: Payer: Self-pay | Admitting: Family Medicine

## 2013-10-23 NOTE — Telephone Encounter (Signed)
ok 

## 2013-10-23 NOTE — Telephone Encounter (Signed)
Requesting a refill on Tramadol - ? OK to Refill  

## 2013-10-24 MED ORDER — TRAMADOL HCL 50 MG PO TABS: ORAL_TABLET | ORAL | Status: DC

## 2013-10-24 NOTE — Telephone Encounter (Signed)
Med sent to pharm 

## 2013-11-14 ENCOUNTER — Ambulatory Visit (INDEPENDENT_AMBULATORY_CARE_PROVIDER_SITE_OTHER): Payer: Medicaid Other | Admitting: Physician Assistant

## 2013-11-14 ENCOUNTER — Encounter: Payer: Self-pay | Admitting: Physician Assistant

## 2013-11-14 VITALS — BP 128/72 | HR 78 | Temp 97.9°F | Resp 14 | Ht 73.0 in | Wt 240.0 lb

## 2013-11-14 DIAGNOSIS — E7800 Pure hypercholesterolemia, unspecified: Secondary | ICD-10-CM

## 2013-11-14 DIAGNOSIS — F419 Anxiety disorder, unspecified: Secondary | ICD-10-CM

## 2013-11-14 DIAGNOSIS — L089 Local infection of the skin and subcutaneous tissue, unspecified: Secondary | ICD-10-CM

## 2013-11-14 DIAGNOSIS — M1990 Unspecified osteoarthritis, unspecified site: Secondary | ICD-10-CM

## 2013-11-14 DIAGNOSIS — I10 Essential (primary) hypertension: Secondary | ICD-10-CM

## 2013-11-14 DIAGNOSIS — L918 Other hypertrophic disorders of the skin: Secondary | ICD-10-CM

## 2013-11-14 DIAGNOSIS — L909 Atrophic disorder of skin, unspecified: Secondary | ICD-10-CM

## 2013-11-14 DIAGNOSIS — L919 Hypertrophic disorder of the skin, unspecified: Secondary | ICD-10-CM

## 2013-11-14 DIAGNOSIS — F411 Generalized anxiety disorder: Secondary | ICD-10-CM

## 2013-11-14 DIAGNOSIS — M129 Arthropathy, unspecified: Secondary | ICD-10-CM

## 2013-11-14 DIAGNOSIS — Z0000 Encounter for general adult medical examination without abnormal findings: Secondary | ICD-10-CM

## 2013-11-14 LAB — CBC WITH DIFFERENTIAL/PLATELET
Basophils Absolute: 0.1 10*3/uL (ref 0.0–0.1)
Basophils Relative: 1 % (ref 0–1)
EOS ABS: 0.3 10*3/uL (ref 0.0–0.7)
Eosinophils Relative: 5 % (ref 0–5)
HCT: 40.7 % (ref 39.0–52.0)
Hemoglobin: 14.1 g/dL (ref 13.0–17.0)
Lymphocytes Relative: 36 % (ref 12–46)
Lymphs Abs: 2.3 10*3/uL (ref 0.7–4.0)
MCH: 30.7 pg (ref 26.0–34.0)
MCHC: 34.6 g/dL (ref 30.0–36.0)
MCV: 88.7 fL (ref 78.0–100.0)
Monocytes Absolute: 0.6 10*3/uL (ref 0.1–1.0)
Monocytes Relative: 10 % (ref 3–12)
NEUTROS PCT: 48 % (ref 43–77)
Neutro Abs: 3.1 10*3/uL (ref 1.7–7.7)
Platelets: 226 10*3/uL (ref 150–400)
RBC: 4.59 MIL/uL (ref 4.22–5.81)
RDW: 13.7 % (ref 11.5–15.5)
WBC: 6.4 10*3/uL (ref 4.0–10.5)

## 2013-11-14 LAB — COMPLETE METABOLIC PANEL WITH GFR
ALBUMIN: 4.5 g/dL (ref 3.5–5.2)
ALT: 32 U/L (ref 0–53)
AST: 23 U/L (ref 0–37)
Alkaline Phosphatase: 85 U/L (ref 39–117)
BILIRUBIN TOTAL: 0.5 mg/dL (ref 0.2–1.2)
BUN: 11 mg/dL (ref 6–23)
CO2: 28 mEq/L (ref 19–32)
Calcium: 9.1 mg/dL (ref 8.4–10.5)
Chloride: 101 mEq/L (ref 96–112)
Creat: 0.86 mg/dL (ref 0.50–1.35)
GFR, Est African American: >89 mL/min
GLUCOSE: 95 mg/dL (ref 70–99)
POTASSIUM: 4.2 meq/L (ref 3.5–5.3)
SODIUM: 136 meq/L (ref 135–145)
TOTAL PROTEIN: 6.6 g/dL (ref 6.0–8.3)

## 2013-11-14 LAB — LIPID PANEL
Cholesterol: 191 mg/dL (ref 0–200)
HDL: 37 mg/dL — ABNORMAL LOW (ref >39)
LDL Cholesterol: 109 mg/dL — ABNORMAL HIGH (ref 0–99)
Total CHOL/HDL Ratio: 5.2 Ratio
Triglycerides: 225 mg/dL — ABNORMAL HIGH (ref <150)
VLDL: 45 mg/dL — ABNORMAL HIGH (ref 0–40)

## 2013-11-14 LAB — TSH: TSH: 2.32 u[IU]/mL (ref 0.350–4.500)

## 2013-11-14 MED ORDER — BENAZEPRIL HCL 20 MG PO TABS: ORAL_TABLET | ORAL | Status: DC

## 2013-11-14 NOTE — Progress Notes (Signed)
Patient ID: Vincent Summers MRN: [id number], DOB: 04-28-1960, 53 y.o. Date of Encounter: 11/14/2013, 11:45 AM    Chief Complaint:  Chief Complaint  Patient presents with  . Skin tag under L arm    would like removed  . Medication review     HPI: 53 y.o. year old white male here to followup hypertension. Says that he was told he did not have any further refills until he had office visit and lab work. Taking benazepril every day. No adverse effects.  Says he has to take the omeprazole daily or else he has acid reflux symptoms. However, as long as he takes the omeprazole he has no symptoms and his symptoms are well-controlled with this medicine.  Has some skin tags in his left axilla that are very irritating. Wants to have these removed.  No other complaints today.     Home Meds:   Outpatient Prescriptions Prior to Visit  Medication Sig Dispense Refill  . Ca Carbonate-Mag Hydroxide (ROLAIDS) 550-110 MG CHEW Chew 1 tablet by mouth every 2 (two) hours as needed (heart burn).      . minocycline (MINOCIN,DYNACIN) 100 MG capsule Take 1 capsule (100 mg total) by mouth 2 (two) times daily.  60 capsule  5  . omeprazole (PRILOSEC) 20 MG capsule TAKE 2 CAPSULES BY MOUTH 2 TIMES A DAY  60 capsule  11  . traMADol (ULTRAM) 50 MG tablet TAKE 1 TO 2 TABLETS BY MOUTH EVERY 6 HOURS AS NEEDED FOR PAIN  60 tablet  1  . benazepril (LOTENSIN) 20 MG tablet TAKE 1 TABLET EVERY DAY  30 tablet  1   No facility-administered medications prior to visit.    Allergies: No Known Allergies    Review of Systems: See HPI for pertinent ROS. All other ROS negative.    Physical Exam: Blood pressure 128/72, pulse 78, temperature 97.9 F (36.6 C), temperature source Oral, resp. rate 14, height 6\' 1"  (1.854 m), weight 240 lb (108.863 kg)., Body mass index is 31.67 kg/(m^2). General:  WNWD WM. Appears in no acute distress. Neck: Supple. No thyromegaly. No lymphadenopathy. No carotid bruits. Lungs: Clear  bilaterally to auscultation without wheezes, rales, or rhonchi. Breathing is unlabored. Heart: Regular rhythm. No murmurs, rubs, or gallops. Abdomen: Soft, non-tender, non-distended with normoactive bowel sounds. No hepatomegaly. No rebound/guarding. No obvious abdominal masses. Msk:  Strength and tone normal for age. Extremities/Skin: Warm and dry. He has 5 skin tags in the left axilla region. 3 of them are approx 0.75 cm diameter. 2 of them are approx 0.5 cm size. Neuro: Alert and oriented X 3. Moves all extremities spontaneously. Gait is normal. CNII-XII grossly in tact. Psych:  Responds to questions appropriately with a normal affect.     ASSESSMENT AND PLAN:  53 y.o. year old male with     1. Essential hypertension - COMPLETE METABOLIC PANEL WITH GFR - benazepril (LOTENSIN) 20 MG tablet; TAKE 1 TABLET EVERY DAY  Dispense: 30 tablet; Refill: 5  3. Hypercholesteremia - COMPLETE METABOLIC PANEL WITH GFR - Lipid panel   5. Visit for preventive health examination I recommended that he have a complete physical exam. He is agreeable to schedule a complete physical exam here in the next week. He is fasting today so he will go ahead and do the fasting screening labs for this. I explained insurance coverage with him and explained the fact that he must come for the complete physical exam within 1-2 weeks of this lab work to  make sure that it is covered by his insurance and he is agreeable with this. - CBC with Differential - COMPLETE METABOLIC PANEL WITH GFR - Lipid panel - PSA - TSH  6. Inflamed skin tag - Pathology   Signed, Vincent Summers [place], [place], [facility] 11/14/2013 11:45 AM

## 2013-11-15 LAB — PSA: PSA: 1.1 ng/mL (ref <=4.00)

## 2013-11-16 LAB — PATHOLOGY

## 2013-11-21 ENCOUNTER — Ambulatory Visit (INDEPENDENT_AMBULATORY_CARE_PROVIDER_SITE_OTHER): Payer: PRIVATE HEALTH INSURANCE | Admitting: Physician Assistant

## 2013-11-21 ENCOUNTER — Encounter: Payer: Self-pay | Admitting: Physician Assistant

## 2013-11-21 VITALS — BP 126/84 | HR 80 | Temp 97.8°F | Resp 14 | Ht 73.0 in | Wt 235.0 lb

## 2013-11-21 DIAGNOSIS — E7800 Pure hypercholesterolemia, unspecified: Secondary | ICD-10-CM

## 2013-11-21 DIAGNOSIS — F411 Generalized anxiety disorder: Secondary | ICD-10-CM

## 2013-11-21 DIAGNOSIS — I10 Essential (primary) hypertension: Secondary | ICD-10-CM

## 2013-11-21 DIAGNOSIS — K219 Gastro-esophageal reflux disease without esophagitis: Secondary | ICD-10-CM | POA: Insufficient documentation

## 2013-11-21 DIAGNOSIS — F419 Anxiety disorder, unspecified: Secondary | ICD-10-CM

## 2013-11-21 DIAGNOSIS — M129 Arthropathy, unspecified: Secondary | ICD-10-CM

## 2013-11-21 DIAGNOSIS — Z0000 Encounter for general adult medical examination without abnormal findings: Secondary | ICD-10-CM

## 2013-11-21 DIAGNOSIS — M1990 Unspecified osteoarthritis, unspecified site: Secondary | ICD-10-CM

## 2013-11-21 NOTE — Progress Notes (Signed)
Patient ID: Vincent Summers MRN: [id number], DOB: 1960/05/29 53 y.o. Date of Encounter: 11/21/2013, 12:54 PM    Chief Complaint: Physical (CPE)  HPI: 53 y.o. y/o male here for CPE.  He had recent office visit with me on 11/14/13 have some skin tags removed and also to followup his hypertension. At the time of that visit he was fasting so we discussed going ahead and doing fasting complete lab work and scheduling a followup complete physical exam and he was agreeable and presents today for this CPE.  He has no complaints today.    Review of Systems: Consitutional: No fever, chills, fatigue, night sweats, lymphadenopathy, or weight changes. Eyes: No visual changes, eye redness, or discharge. ENT/Mouth: Ears: No otalgia, tinnitus, hearing loss, discharge. Nose: No congestion, rhinorrhea, sinus pain, or epistaxis. Throat: No sore throat, post nasal drip, or teeth pain. Cardiovascular: No CP, palpitations, diaphoresis, DOE, edema, orthopnea, PND. Respiratory: No cough, hemoptysis, SOB, or wheezing. Gastrointestinal: No anorexia, dysphagia, reflux, pain, nausea, vomiting, hematemesis, diarrhea, constipation, BRBPR, or melena. Genitourinary: No dysuria, frequency, urgency, hematuria, incontinence, nocturia, decreased urinary stream, discharge, impotence, or testicular pain/masses. Musculoskeletal: No decreased ROM, myalgias, stiffness, joint swelling, or weakness. Skin: No rash, erythema, lesion changes, pain, warmth, jaundice, or pruritis. Neurological: No headache, dizziness, syncope, seizures, tremors, memory loss, coordination problems, or paresthesias. Psychological: No anxiety, depression, hallucinations, SI/HI. Endocrine: No fatigue, polydipsia, polyphagia, polyuria, or known diabetes. All other systems were reviewed and are otherwise negative.  Past Medical History  Diagnosis Date  . Arthritis   . GERD (gastroesophageal reflux disease)   . Hypercholesteremia   . Hypertension   .  Anxiety   . Rosacea      Past Surgical History  Procedure Laterality Date  . Knee surgery    . Appendectomy    . Tonsillectomy    . Shoulder surgery      Home Meds:  Outpatient Prescriptions Prior to Visit  Medication Sig Dispense Refill  . benazepril (LOTENSIN) 20 MG tablet TAKE 1 TABLET EVERY DAY  30 tablet  5  . Ca Carbonate-Mag Hydroxide (ROLAIDS) 550-110 MG CHEW Chew 1 tablet by mouth every 2 (two) hours as needed (heart burn).      . minocycline (MINOCIN,DYNACIN) 100 MG capsule Take 1 capsule (100 mg total) by mouth 2 (two) times daily.  60 capsule  5  . omeprazole (PRILOSEC) 20 MG capsule TAKE 2 CAPSULES BY MOUTH 2 TIMES A DAY  60 capsule  11  . traMADol (ULTRAM) 50 MG tablet TAKE 1 TO 2 TABLETS BY MOUTH EVERY 6 HOURS AS NEEDED FOR PAIN  60 tablet  1   No facility-administered medications prior to visit.    Allergies: No Known Allergies  History   Social History  . Marital Status: Married    Spouse Name: N/A    Number of Children: N/A  . Years of Education: N/A   Occupational History  . Not on file.   Social History Main Topics  . Smoking status: Never Smoker   . Smokeless tobacco: Never Used  . Alcohol Use: 8.4 oz/week    14 Cans of beer per week  . Drug Use: No  . Sexual Activity: Yes   Other Topics Concern  . 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   Does not smoke.   Drinks 2 beers each evening. Drinks about the  same amount on the weekends. No wine or liquor.      Married with 4 children-- all of them are in the house.   He has a 52 year old son he is going [place] and then should be moving out of the house soon.   Has an 52 year old and he has 52 year old twins.    Family History  Problem Relation Age of Onset  . Emphysema Mother   . Skin cancer Mother   . Leukemia Father   . Heart disease Father 97    CAD---CABG @ age 50  . Arrhythmia Father    . Arrhythmia Sister     Physical Exam: Blood pressure 126/84, pulse 80, temperature 97.8 F (36.6 C), temperature source Oral, resp. rate 14, height 6\' 1"  (1.854 m), weight 235 lb (106.595 kg).  General: Well developed, well nourished, WM. Appears in no acute distress. HEENT: Normocephalic, atraumatic. Conjunctiva pink, sclera non-icteric. Pupils 2 mm constricting to 1 mm, round, regular, and equally reactive to light and accomodation. EOMI. Internal auditory canal clear. TMs with good cone of light and without pathology. Nasal mucosa pink. Nares are without discharge. No sinus tenderness. Oral mucosa pink. Pharynx without exudate.   Neck: Supple. Trachea midline. No thyromegaly. Full ROM. No lymphadenopathy. Lungs: Clear to auscultation bilaterally without wheezes, rales, or rhonchi. Breathing is of normal effort and unlabored. Cardiovascular: RRR with S1 S2. No murmurs, rubs, or gallops. Distal pulses 2+ symmetrically. No carotid or abdominal bruits. Abdomen: Soft, non-tender, non-distended with normoactive bowel sounds. No hepatosplenomegaly or masses. No rebound/guarding. No CVA tenderness. No hernias. Rectal: Deferred. PSA normal. Asymptomatic.  Musculoskeletal: Full range of motion and 5/5 strength throughout. Without swelling, atrophy, tenderness, crepitus, or warmth. Extremities without clubbing, cyanosis, or edema. Calves supple. Skin: Warm and moist without erythema, ecchymosis, wounds, or rash. Neuro: A+Ox3. CN II-XII grossly intact. Moves all extremities spontaneously. Full sensation throughout. Normal gait. DTR 2+ throughout upper and lower extremities. Finger to nose intact. Psych:  Responds to questions appropriately with a normal affect.   Assessment/Plan:  53 y.o. y/o  male  male here for CPE  -1. Visit for preventive health examination  A. Screening Labs: He had screening fasting labs performed on 11/14/13. CBC normal CMET normal FLP--- triglyceride 225   HDL 37  LDL  109 PSA normal TSH normal  B. Screening For Prostate Cancer: PSA normal. DRE deferred.  C. Screening For Colorectal Cancer:  Patient states he had a colonoscopy and endoscopy in May 2014 by Dr. Oletta Lamas at [place]. Patient reports that colonoscopy was normal. If this is the case, then repeat 10 years.  D. Immunizations: Flu----N/A Tetanus----not covered by insurance so deferred Pneumococcal--- he has no indication to require this until age 2 Zostavax--we'll discuss at age 35   2. Essential hypertension Blood pressure at goal. Recent lab normal. Continue current medication.  3. Gastroesophageal reflux disease, esophagitis presence not specified Symptoms controlled with current medication.  Routine office visit in 6 months or sooner if needed.   Signed:   [address] [place], [place]  11/21/2013 12:54 PM

## 2013-12-04 ENCOUNTER — Other Ambulatory Visit: Payer: Self-pay | Admitting: Family Medicine

## 2013-12-04 ENCOUNTER — Telehealth: Payer: Self-pay | Admitting: Family Medicine

## 2013-12-04 NOTE — Telephone Encounter (Signed)
ok 

## 2013-12-04 NOTE — Telephone Encounter (Signed)
Requesting a refill on Tramadol 50mg  1-2 tab po q 6 hours prn pain - ? OK to Refill

## 2013-12-05 MED ORDER — TRAMADOL HCL 50 MG PO TABS: ORAL_TABLET | ORAL | Status: DC

## 2013-12-05 NOTE — Telephone Encounter (Signed)
Med called to pharm 

## 2013-12-11 ENCOUNTER — Other Ambulatory Visit: Payer: Self-pay | Admitting: Family Medicine

## 2014-01-10 ENCOUNTER — Ambulatory Visit (INDEPENDENT_AMBULATORY_CARE_PROVIDER_SITE_OTHER): Payer: Medicaid Other | Admitting: Family Medicine

## 2014-01-10 ENCOUNTER — Encounter: Payer: Self-pay | Admitting: Family Medicine

## 2014-01-10 VITALS — BP 130/80 | HR 76 | Temp 97.9°F | Resp 18 | Ht 73.0 in | Wt 238.0 lb

## 2014-01-10 DIAGNOSIS — F410 Panic disorder [episodic paroxysmal anxiety] without agoraphobia: Secondary | ICD-10-CM

## 2014-01-10 DIAGNOSIS — R0789 Other chest pain: Secondary | ICD-10-CM

## 2014-01-10 MED ORDER — ALPRAZOLAM 0.5 MG PO TABS: 0.5000 mg | ORAL_TABLET | Freq: Three times a day (TID) | ORAL | Status: DC | PRN

## 2014-01-10 NOTE — Addendum Note (Signed)
Addended by: Shary Decamp B on: 01/10/2014 12:36 PM   Modules accepted: Orders

## 2014-01-10 NOTE — Progress Notes (Signed)
Subjective:    Patient ID: Vincent Summers, male    DOB: 1961/01/02, 53 y.o.   MRN: [id number]  HPI Patient presents with a one-month history of panic attacks. He describes the panic attacks as substernal chest pain which radiates into his left arm. He also feels like he cannot breathe at the time these occurred. They tend to last several hours but resolved gradually on her iron. He believes it is anxiety as he's had similar anxiety attacks in the past. However he has a past medical history of hypertension. He also has a past medical history of premature cardiovascular disease in his father. His father had a heart attack in his early 54s. Patient had a clear cardiac catheterization in 2007. He had no further stress test since. He denies any dyspnea on exertion. He denies any angina. He denies any pleurisy. EKG obtained today in the office shows normal sinus rhythm at 63 beats per minute with normal intervals and normal axis and no evidence of ischemia or infarction. Past Medical History  Diagnosis Date  . Arthritis   . GERD (gastroesophageal reflux disease)   . Hypercholesteremia   . Hypertension   . Anxiety   . Rosacea    Past Surgical History  Procedure Laterality Date  . Knee surgery    . Appendectomy    . Tonsillectomy    . Shoulder surgery     Current Outpatient Prescriptions on File Prior to Visit  Medication Sig Dispense Refill  . benazepril (LOTENSIN) 20 MG tablet TAKE 1 TABLET EVERY DAY  30 tablet  5  . minocycline (MINOCIN,DYNACIN) 100 MG capsule TAKE 1 CAPSULE TWICE A DAY  60 capsule  5  . omeprazole (PRILOSEC) 20 MG capsule TAKE 2 CAPSULES BY MOUTH 2 TIMES A DAY  60 capsule  11  . traMADol (ULTRAM) 50 MG tablet TAKE 1 TO 2 TABLETS BY MOUTH EVERY 6 HOURS AS NEEDED FOR PAIN  60 tablet  1   No current facility-administered medications on file prior to visit.   No Known Allergies History   Social History  . Marital Status: Married    Spouse Name: N/A    Number of  Children: N/A  . Years of Education: N/A   Occupational History  . Not on file.   Social History Main Topics  . Smoking status: Never Smoker   . Smokeless tobacco: Never Used  . Alcohol Use: 8.4 oz/week    14 Cans of beer per week  . Drug Use: No  . Sexual Activity: Yes   Other Topics Concern  . 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   Does not smoke.   Drinks 2 beers each evening. Drinks about the same amount on the weekends. No wine or liquor.      Married with 4 children-- all of them are in the house.   He has a 81 year old son he is going [place] and then should be moving out of the house soon.   Has an 52 year old and he has 52 year old twins.      Review of Systems  All other systems reviewed and are negative.      Objective:   Physical Exam  Vitals reviewed. Constitutional: He appears well-developed and well-nourished. No distress.  Neck: Neck supple. No JVD present.  Cardiovascular: Normal rate, regular rhythm, normal heart sounds and intact distal pulses.  Exam reveals no gallop and no friction rub.   No murmur heard. Pulmonary/Chest: Effort normal and breath sounds normal. No respiratory distress. He has no wheezes. He has no rales.  Abdominal: Soft. Bowel sounds are normal. He exhibits no distension. There is no tenderness. There is no rebound and no guarding.  Musculoskeletal: He exhibits no edema.  Lymphadenopathy:    He has no cervical adenopathy.  Skin: He is not diaphoretic.          Assessment & Plan:  Panic attack - Plan: ALPRAZolam (XANAX) 0.5 MG tablet  Other chest pain - Plan: Ambulatory referral to Cardiology  Patient's EKG is normal. He believes his symptoms are due to anxiety attacks as they're similar to his infancy previously experienced. However given his age, has high blood pressure, and his family history, I do recommend a  cardiology referral for a stress test. That being said I will give the patient Xanax 0.5 mg by mouth every 8 hours when necessary anxiety attacks. At the present time he is having one attack every week. He would only require medicine for 5 times per month. If this calms his symptoms and his stress test is normal, no further workup is needed. The patient continues to have symptoms or the symptoms occur more frequently and his stress test is normal, I would institute further workup for chest pain and start the patient on a daily SSRI.

## 2014-02-10 ENCOUNTER — Other Ambulatory Visit: Payer: Self-pay | Admitting: Family Medicine

## 2014-02-11 NOTE — Telephone Encounter (Signed)
Ok to refill??  Last office visit/ refill 01/10/2014.

## 2014-02-11 NOTE — Telephone Encounter (Signed)
ok 

## 2014-02-11 NOTE — Telephone Encounter (Signed)
Medication called to pharmacy. 

## 2014-02-28 ENCOUNTER — Encounter: Payer: Self-pay | Admitting: Internal Medicine

## 2014-02-28 ENCOUNTER — Ambulatory Visit (INDEPENDENT_AMBULATORY_CARE_PROVIDER_SITE_OTHER): Payer: PRIVATE HEALTH INSURANCE | Admitting: Internal Medicine

## 2014-02-28 VITALS — BP 154/90 | HR 67 | Ht 74.0 in | Wt 240.7 lb

## 2014-02-28 DIAGNOSIS — Z8249 Family history of ischemic heart disease and other diseases of the circulatory system: Secondary | ICD-10-CM

## 2014-02-28 DIAGNOSIS — R0789 Other chest pain: Secondary | ICD-10-CM

## 2014-02-28 DIAGNOSIS — R079 Chest pain, unspecified: Secondary | ICD-10-CM

## 2014-02-28 DIAGNOSIS — I10 Essential (primary) hypertension: Secondary | ICD-10-CM

## 2014-02-28 NOTE — Patient Instructions (Signed)
Your physician has requested that you have an exercise tolerance test. For further information please visit [email or web address]. Please also follow instruction sheet, as given.  Dr. Debara Pickett has ordered a CT calcium score - done at [address] [facility]   Your physician recommends that you schedule a follow-up appointment after your tests.

## 2014-02-28 NOTE — Progress Notes (Signed)
OFFICE NOTE  Chief Complaint:  Chest pain  Primary Care Physician: Odette Fraction, MD  HPI:  Vincent Summers is a pleasant 53 year old male with a strong family history of coronary disease. He reports that his father had heart disease and prior bypass as well as a defibrillator implanted and died at age 76. There is also extensive heart disease and no sisters who have arrhythmias and a pacemaker as well as in his grandparents. He does have a history of anxiety is well. He's recently had some chest pain symptoms which are reminiscent of pain these had many years in the past. In 2007 he complained of some chest pain and underwent cardiac catheterization by Dr. Haroldine Laws which demonstrated essentially normal coronary arteries with normal LV function. He's developed some mild hypertension and is on medication for that but was told that he has good cholesterol control with a total cholesterol less than 200. Recently he's been having some discomfort in his chest and tingling in his left arm. The symptoms are little bit worse with exertion and relieved somewhat by rest but can occur at other times without doing activities. 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  PMHx:  Past Medical History  Diagnosis Date  . Arthritis   . GERD (gastroesophageal reflux disease)   . Hypercholesteremia   . Hypertension   . Anxiety   . Rosacea     Past Surgical History  Procedure Laterality Date  . Knee surgery    . Appendectomy    . Tonsillectomy    . Shoulder surgery    . Cardiac catheterization  2007    Dr. Haroldine Laws    FAMHx:  Family History  Problem Relation Age of Onset  . Emphysema Mother   . Skin cancer Mother   . Leukemia Father   . Heart disease Father 102    CAD---CABG @ age 82  . Arrhythmia Father   . Arrhythmia Sister   . Stroke Maternal Grandmother 90  . Heart disease Maternal Grandfather   . Stroke Maternal Grandfather 67  .  Hypertension Paternal Grandmother   . Stroke Paternal Grandmother 85  . Heart disease Paternal Grandfather 62    SOCHx:   reports that he has never smoked. He has never used smokeless tobacco. He reports that he drinks about 8.4 oz of alcohol per week. He reports that he does not use illicit drugs.  ALLERGIES:  No Known Allergies  ROS: A comprehensive review of systems was negative except for: Cardiovascular: positive for exertional chest pressure/discomfort  HOME MEDS: Current Outpatient Prescriptions  Medication Sig Dispense Refill  . ALPRAZolam (XANAX) 0.5 MG tablet TAKE 1 TABLET THREE TIMES A DAY AS NEEDED FOR ANXIETY 30 tablet 0  . benazepril (LOTENSIN) 20 MG tablet TAKE 1 TABLET EVERY DAY 30 tablet 5  . minocycline (MINOCIN,DYNACIN) 100 MG capsule TAKE 1 CAPSULE TWICE A DAY 60 capsule 5  . omeprazole (PRILOSEC) 20 MG capsule TAKE 2 CAPSULES BY MOUTH 2 TIMES A DAY 60 capsule 11  . traMADol (ULTRAM) 50 MG tablet TAKE 1 TO 2 TABLETS BY MOUTH EVERY 6 HOURS AS NEEDED FOR PAIN 60 tablet 1   No current facility-administered medications for this visit.    LABS/IMAGING: No results found for this or any previous visit (from the past 48 hour(s)). No results found.  VITALS: BP 154/90 mmHg  Pulse 67  Ht 6\' 2"  (1.88 m)  Wt 240 lb 11.2 oz (109.181  kg)  BMI 30.89 kg/m2  EXAM: General appearance: alert and no distress Neck: no carotid bruit and no JVD Lungs: clear to auscultation bilaterally Heart: regular rate and rhythm, S1, S2 normal, no murmur, click, rub or gallop Abdomen: soft, non-tender; bowel sounds normal; no masses,  no organomegaly Extremities: extremities normal, atraumatic, no cyanosis or edema Pulses: 2+ and symmetric Skin: Skin color, texture, turgor normal. No rashes or lesions Neurologic: Grossly normal Psych: Normal  EKG: Normal sinus rhythm at 67  ASSESSMENT: 1. Atypical chest pain 2. Strong family history of coronary  disease 3. Hypertension  PLAN: 1.   Mr. Eatherly is describing chest pain which sounds somewhat atypical. He does have significant family history which puts him at risk however he had cardiac catheterization in 2007 which was essentially normal. It is possible he could've developed heart disease since 2007. I would recommend an exercise treadmill stress test. It would also be helpful to get a corner calcium score to see if he developed any significant coronary disease. Blood pressure appears to be well-controlled today. A repeat blood pressure check was 130/76 at the end of his office visit. If he does have significant coronary calcium, we may need to consider further optimization of his cholesterol and perhaps even starting a statin.   Thank you for the kind referral. I plan to see him back in a few weeks to discuss results of stress test and calcium score.  Pixie Casino, MD, [facility] Attending Cardiologist [facility]  HILTY,Kenneth C 02/28/2014, 1:24 PM

## 2014-03-05 ENCOUNTER — Other Ambulatory Visit: Payer: Self-pay | Admitting: Family Medicine

## 2014-03-05 ENCOUNTER — Telehealth: Payer: Self-pay | Admitting: Family Medicine

## 2014-03-05 MED ORDER — TRAMADOL HCL 50 MG PO TABS: ORAL_TABLET | ORAL | Status: DC

## 2014-03-05 NOTE — Telephone Encounter (Signed)
Medication refilled per protocol. 

## 2014-03-05 NOTE — Telephone Encounter (Signed)
Pharmacy request refill of Tramadol.  LRF 9/16 #60 + 1.  LOV 10/22  OK refill?

## 2014-03-05 NOTE — Telephone Encounter (Signed)
ok 

## 2014-03-05 NOTE — Telephone Encounter (Signed)
rx called in

## 2014-03-06 ENCOUNTER — Other Ambulatory Visit: Payer: Self-pay | Admitting: *Deleted

## 2014-03-06 NOTE — Telephone Encounter (Signed)
Received fax requesting refill on Tramadol.   Medication was filled on 03/05/2014.

## 2014-03-19 ENCOUNTER — Other Ambulatory Visit: Payer: Self-pay | Admitting: Family Medicine

## 2014-03-19 NOTE — Telephone Encounter (Signed)
Medication called to pharmacy. 

## 2014-03-19 NOTE — Telephone Encounter (Signed)
Ok to refill??  Last office visit 01/10/2014.  Last refill 02/11/2014.

## 2014-03-19 NOTE — Telephone Encounter (Signed)
ok 

## 2014-03-28 ENCOUNTER — Telehealth (HOSPITAL_COMMUNITY): Payer: Self-pay

## 2014-03-28 NOTE — Telephone Encounter (Signed)
Encounter complete. 

## 2014-03-29 ENCOUNTER — Telehealth (HOSPITAL_COMMUNITY): Payer: Self-pay

## 2014-03-29 NOTE — Telephone Encounter (Signed)
Encounter complete. 

## 2014-04-02 ENCOUNTER — Ambulatory Visit (HOSPITAL_COMMUNITY)
Admission: RE | Admit: 2014-04-02 | Discharge: 2014-04-02 | Disposition: A | Discharge status: Home / Self Care | Payer: PRIVATE HEALTH INSURANCE | Source: Ambulatory Visit | Attending: Cardiovascular Disease | Admitting: Cardiovascular Disease

## 2014-04-02 ENCOUNTER — Ambulatory Visit (INDEPENDENT_AMBULATORY_CARE_PROVIDER_SITE_OTHER)
Admission: RE | Admit: 2014-04-02 | Discharge: 2014-04-02 | Disposition: A | Discharge status: Home / Self Care | Payer: Self-pay | Source: Ambulatory Visit | Attending: Internal Medicine | Admitting: Internal Medicine

## 2014-04-02 DIAGNOSIS — R079 Chest pain, unspecified: Secondary | ICD-10-CM | POA: Diagnosis not present

## 2014-04-02 DIAGNOSIS — I10 Essential (primary) hypertension: Secondary | ICD-10-CM

## 2014-04-02 DIAGNOSIS — Z8249 Family history of ischemic heart disease and other diseases of the circulatory system: Secondary | ICD-10-CM | POA: Diagnosis not present

## 2014-04-02 NOTE — Procedures (Signed)
Exercise Treadmill Test   Test  Exercise Tolerance Test Ordering MD: Pixie Casino, MD    Unique Test No: 1  Treadmill:  1  Indication for ETT: chest pain - rule out ischemia  Contraindication to ETT: No   Stress Modality: exercise - treadmill  Cardiac Imaging Performed: non   Protocol: standard Bruce - maximal  Max BP:  225/103  Max MPHR (bpm):  168 85% MPR (bpm):  143  MPHR obtained (bpm):  173 % MPHR obtained:  102  Reached 85% MPHR (min:sec):  6:15 Total Exercise Time (min-sec):  10  Workload in METS:  11.7 Borg Scale: 15  Reason ETT Terminated:  SOB and Fatigue    ST Segment Analysis At Rest: normal ST segments - no evidence of significant ST depression With Exercise: no evidence of significant ST depression  Other Information Arrhythmia:  No Angina during ETT:  absent (0) Quality of ETT:  diagnostic  ETT Interpretation:  normal - no evidence of ischemia by ST analysis  Comments: The patient had an good exercise tolerance.  There was no chest pain.  There was an appropriate level of dyspnea.  There were no arrhythmias, a normal heart rate response an accelerated BP response.  There were no ischemic ST T wave changes and a normal heart rate recovery.  Recommendations: No diagnostic EKG changes.  There was a hypertensive BP response.

## 2014-04-15 ENCOUNTER — Encounter: Payer: Self-pay | Admitting: Internal Medicine

## 2014-04-15 ENCOUNTER — Ambulatory Visit (INDEPENDENT_AMBULATORY_CARE_PROVIDER_SITE_OTHER): Payer: PRIVATE HEALTH INSURANCE | Admitting: Internal Medicine

## 2014-04-15 VITALS — BP 140/76 | HR 74 | Ht 74.0 in | Wt 249.3 lb

## 2014-04-15 DIAGNOSIS — R0789 Other chest pain: Secondary | ICD-10-CM

## 2014-04-15 DIAGNOSIS — I10 Essential (primary) hypertension: Secondary | ICD-10-CM

## 2014-04-15 DIAGNOSIS — K760 Fatty (change of) liver, not elsewhere classified: Secondary | ICD-10-CM | POA: Insufficient documentation

## 2014-04-15 DIAGNOSIS — E785 Hyperlipidemia, unspecified: Secondary | ICD-10-CM | POA: Insufficient documentation

## 2014-04-15 DIAGNOSIS — F419 Anxiety disorder, unspecified: Secondary | ICD-10-CM

## 2014-04-15 NOTE — Progress Notes (Signed)
OFFICE NOTE  Chief Complaint:  Follow-up studies  Primary Care Physician: Odette Fraction, MD  HPI:  Vincent Summers is a pleasant 54 year old male with a strong family history of coronary disease. He reports that his father had heart disease and prior bypass as well as a defibrillator implanted and died at age 58. There is also extensive heart disease and no sisters who have arrhythmias and a pacemaker as well as in his grandparents. He does have a history of anxiety is well. He's recently had some chest pain symptoms which are reminiscent of pain these had many years in the past. In 2007 he complained of some chest pain and underwent cardiac catheterization by Dr. Haroldine Laws which demonstrated essentially normal coronary arteries with normal LV function. He's developed some mild hypertension and is on medication for that but was told that he has good cholesterol control with a total cholesterol less than 200. Recently he's been having some discomfort in his chest and tingling in his left arm. The symptoms are little bit worse with exertion and relieved somewhat by rest but can occur at other times without doing activities. 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  Vincent Summers returns today for follow-up. He underwent an exercise stress test which was negative for ischemia but did show hypertensive response. He had a CT calcium score which resulted is 0. This is highly suggestive of low risk of coronary events. Incidentally, it was noted by the radiologist that there is a nonalcoholic fatty liver disease. It is noted that his cholesterol is elevated and he reports drinking a couple of beers daily after work.  PMHx:  Past Medical History  Diagnosis Date  . Arthritis   . GERD (gastroesophageal reflux disease)   . Hypercholesteremia   . Hypertension   . Anxiety   . Rosacea     Past Surgical History  Procedure Laterality Date  . Knee surgery      . Appendectomy    . Tonsillectomy    . Shoulder surgery    . Cardiac catheterization  2007    Dr. Haroldine Laws    FAMHx:  Family History  Problem Relation Age of Onset  . Emphysema Mother   . Skin cancer Mother   . Leukemia Father   . Heart disease Father 19    CAD---CABG @ age 55  . Arrhythmia Father   . Arrhythmia Sister   . Stroke Maternal Grandmother 90  . Heart disease Maternal Grandfather   . Stroke Maternal Grandfather 57  . Hypertension Paternal Grandmother   . Stroke Paternal Grandmother 85  . Heart disease Paternal Grandfather 76    SOCHx:   reports that he has never smoked. He has never used smokeless tobacco. He reports that he drinks about 8.4 oz of alcohol per week. He reports that he does not use illicit drugs.  ALLERGIES:  No Known Allergies  ROS: A comprehensive review of systems was negative.  HOME MEDS: Current Outpatient Prescriptions  Medication Sig Dispense Refill  . ALPRAZolam (XANAX) 0.5 MG tablet TAKE 1 TABLET THREE TIMES A DAY AS NEEDED 30 tablet 0  . benazepril (LOTENSIN) 20 MG tablet TAKE 1 TABLET EVERY DAY 30 tablet 5  . minocycline (MINOCIN,DYNACIN) 100 MG capsule TAKE 1 CAPSULE TWICE A DAY 60 capsule 5  . omeprazole (PRILOSEC) 20 MG capsule TAKE 2 CAPSULES BY MOUTH 2 TIMES A DAY 60 capsule 5  . traMADol (ULTRAM) 50 MG tablet  TAKE 1 TO 2 TABLETS BY MOUTH EVERY 6 HOURS AS NEEDED FOR PAIN 60 tablet 1   No current facility-administered medications for this visit.    LABS/IMAGING: No results found for this or any previous visit (from the past 48 hour(s)). No results found.  VITALS: BP 140/76 mmHg  Pulse 74  Ht 6\' 2"  (1.88 m)  Wt 249 lb 4.8 oz (113.082 kg)  BMI 31.99 kg/m2  EXAM: deferred  EKG: deferred  ASSESSMENT: 1. Atypical chest pain - negative exercise stress test, Zero calcium score 2. Strong family history of coronary disease 3. Hypertension 4. NAFLD  PLAN: 1.   Vincent Summers had a negative exercise treadmill stress  test with good exercise tolerance however a hypertensive response to exercise. His blood pressure is top normal and may need additional adjustment. He did have a 0 calcium score which is predictive of a low risk of events. His cholesterol is elevated with LDL of 109 and triglycerides of 225. He does report drinking a couple of beers every day. I've asked him to cut back to no more than 1 a day and work on decreasing sugar intake and saturated fats. Hopefully this will help him further with his lipid profile. He does have evidence of nonalcoholic fatty liver disease based on his CT scan. We have to be careful about heavily metabolized liver medications and other medications such as Tylenol which could lead to liver enzyme abnormalities.  I'm happy to see him back in the future on an as-needed basis. His studies are reassuring that he currently is at low risk for coronary disease.  Pixie Casino, MD, [facility] Attending Cardiologist [facility]  Juliani Laduke C 04/15/2014, 12:50 PM

## 2014-04-15 NOTE — Patient Instructions (Signed)
Your physician recommends that you schedule a follow-up appointment as needed  

## 2014-04-18 ENCOUNTER — Other Ambulatory Visit: Payer: Self-pay | Admitting: Family Medicine

## 2014-04-18 NOTE — Telephone Encounter (Signed)
rx called in

## 2014-04-18 NOTE — Telephone Encounter (Signed)
ok 

## 2014-04-18 NOTE — Telephone Encounter (Signed)
LRF 12/29 #30.  LOV 01/10/14  OK refill?

## 2014-05-23 ENCOUNTER — Other Ambulatory Visit: Payer: Self-pay | Admitting: Family Medicine

## 2014-05-23 NOTE — Telephone Encounter (Signed)
Ok to refill??  Last office visit 04/15/2014.  Last refill 04/18/2014.

## 2014-05-23 NOTE — Telephone Encounter (Signed)
Medication called to pharmacy. 

## 2014-05-23 NOTE — Telephone Encounter (Signed)
ok 

## 2014-06-21 ENCOUNTER — Other Ambulatory Visit: Payer: Self-pay | Admitting: Family Medicine

## 2014-06-21 NOTE — Telephone Encounter (Signed)
ok 

## 2014-06-21 NOTE — Telephone Encounter (Signed)
LRF 12/15 #60 + 1.  LOV 01/10/14  OK refill?

## 2014-06-25 NOTE — Telephone Encounter (Signed)
rx called on

## 2014-07-03 ENCOUNTER — Other Ambulatory Visit: Payer: Self-pay | Admitting: Family Medicine

## 2014-07-03 NOTE — Telephone Encounter (Signed)
?   OK to Refill  

## 2014-07-03 NOTE — Telephone Encounter (Signed)
Medication called to pharmacy. 

## 2014-07-03 NOTE — Telephone Encounter (Signed)
ok 

## 2014-08-08 ENCOUNTER — Other Ambulatory Visit: Payer: Self-pay | Admitting: Family Medicine

## 2014-08-08 NOTE — Telephone Encounter (Signed)
Refill appropriate and filled per protocol. 

## 2014-08-14 ENCOUNTER — Other Ambulatory Visit: Payer: Self-pay | Admitting: Family Medicine

## 2014-08-14 NOTE — Telephone Encounter (Signed)
?   OK to Refill  

## 2014-08-15 NOTE — Telephone Encounter (Signed)
ok 

## 2014-08-15 NOTE — Telephone Encounter (Signed)
rx called in

## 2014-10-09 ENCOUNTER — Other Ambulatory Visit: Payer: Self-pay | Admitting: Family Medicine

## 2014-10-09 NOTE — Telephone Encounter (Signed)
?   OK to Refill Xanax last refill 08/15/14

## 2014-10-09 NOTE — Telephone Encounter (Signed)
LRF tramadol 06/25/14 #60 + 1.  LOV 01/10/2014  OK refill?

## 2014-10-10 MED ORDER — TRAMADOL HCL 50 MG PO TABS: ORAL_TABLET | ORAL | Status: DC

## 2014-10-10 NOTE — Telephone Encounter (Signed)
Medication refilled per protocol. 

## 2014-10-10 NOTE — Telephone Encounter (Signed)
Ok with all refills.

## 2014-11-05 ENCOUNTER — Other Ambulatory Visit: Payer: Self-pay | Admitting: Family Medicine

## 2014-11-05 NOTE — Telephone Encounter (Signed)
Medication filled x1 with no refills.   Requires office visit before any further refills can be given.   Letter sent.  

## 2014-11-27 ENCOUNTER — Other Ambulatory Visit: Payer: Self-pay | Admitting: Family Medicine

## 2014-11-27 NOTE — Telephone Encounter (Signed)
Ok to refill??  Last office visit 01/10/2014.  Last refill 10/10/2014.

## 2014-11-28 NOTE — Telephone Encounter (Signed)
ok 

## 2014-11-28 NOTE — Telephone Encounter (Signed)
Medication called to pharmacy. 

## 2014-12-09 ENCOUNTER — Other Ambulatory Visit: Payer: Self-pay | Admitting: Family Medicine

## 2014-12-09 NOTE — Telephone Encounter (Signed)
Ok with 1 refill, ntbs

## 2014-12-09 NOTE — Telephone Encounter (Signed)
Ok to refill??  Last office visit 01/10/2014.  Last refill 10/10/2014, #1 refill.

## 2014-12-09 NOTE — Telephone Encounter (Signed)
Medication called to pharmacy.  Letter sent.  

## 2015-01-08 ENCOUNTER — Other Ambulatory Visit: Payer: Self-pay | Admitting: Family Medicine

## 2015-02-03 ENCOUNTER — Telehealth: Payer: Self-pay | Admitting: Family Medicine

## 2015-02-03 ENCOUNTER — Other Ambulatory Visit: Payer: Self-pay | Admitting: Family Medicine

## 2015-02-03 NOTE — Telephone Encounter (Signed)
Requesting a refill on Xanax - ? OK to Refill - LOV 01/11/15 - LRF  11/28/14

## 2015-02-03 NOTE — Telephone Encounter (Signed)
Last office visit in our office was October 2015. Schedule office visit.

## 2015-02-04 NOTE — Telephone Encounter (Signed)
Sent via surescripts - denied - needs ov

## 2015-02-05 ENCOUNTER — Other Ambulatory Visit: Payer: Self-pay | Admitting: Family Medicine

## 2015-02-05 NOTE — Telephone Encounter (Signed)
Medication refill for one time only.  Patient needs to be seen.  Appt next week

## 2015-02-12 ENCOUNTER — Encounter: Payer: Self-pay | Admitting: Family Medicine

## 2015-02-12 ENCOUNTER — Ambulatory Visit (INDEPENDENT_AMBULATORY_CARE_PROVIDER_SITE_OTHER): Payer: Managed Care, Other (non HMO) | Admitting: Family Medicine

## 2015-02-12 VITALS — BP 152/80 | HR 100 | Temp 98.2°F | Resp 18 | Ht 73.0 in | Wt 234.0 lb

## 2015-02-12 DIAGNOSIS — I10 Essential (primary) hypertension: Secondary | ICD-10-CM

## 2015-02-12 DIAGNOSIS — Z23 Encounter for immunization: Secondary | ICD-10-CM

## 2015-02-12 LAB — LIPID PANEL
CHOL/HDL RATIO: 5.5 ratio — AB (ref <=5.0)
CHOLESTEROL: 181 mg/dL (ref 125–200)
HDL: 33 mg/dL — ABNORMAL LOW (ref >=40)
TRIGLYCERIDES: 416 mg/dL — AB (ref <150)

## 2015-02-12 LAB — COMPLETE METABOLIC PANEL WITH GFR
ALT: 39 U/L (ref 9–46)
AST: 27 U/L (ref 10–35)
Albumin: 4.4 g/dL (ref 3.6–5.1)
Alkaline Phosphatase: 102 U/L (ref 40–115)
BUN: 9 mg/dL (ref 7–25)
CALCIUM: 9.3 mg/dL (ref 8.6–10.3)
CHLORIDE: 101 mmol/L (ref 98–110)
CO2: 24 mmol/L (ref 20–31)
Creat: 0.73 mg/dL (ref 0.70–1.33)
GFR, Est Non African American: >89 mL/min (ref >=60)
Glucose, Bld: 96 mg/dL (ref 70–99)
POTASSIUM: 4.1 mmol/L (ref 3.5–5.3)
Sodium: 136 mmol/L (ref 135–146)
Total Bilirubin: 0.5 mg/dL (ref 0.2–1.2)
Total Protein: 7 g/dL (ref 6.1–8.1)

## 2015-02-12 MED ORDER — ALPRAZOLAM 0.5 MG PO TABS: 0.5000 mg | ORAL_TABLET | Freq: Three times a day (TID) | ORAL | Status: DC | PRN

## 2015-02-12 MED ORDER — HYDROCHLOROTHIAZIDE 25 MG PO TABS: 25.0000 mg | ORAL_TABLET | Freq: Every day | ORAL | Status: DC

## 2015-02-12 NOTE — Progress Notes (Signed)
Subjective:    Patient ID: Vincent Summers, male    DOB: Feb 11, 1961, 54 y.o.   MRN: [id number]  HPI Patient has a history of nonalcoholic fatty liver disease on CT scan. He is due to monitor his liver function tests. He is also due to recheck his cholesterol. His blood pressure today is elevated 152/80. He denies any chest pain shortness of breath or dyspnea on exertion. Past Medical History  Diagnosis Date  . Arthritis   . GERD (gastroesophageal reflux disease)   . Hypercholesteremia   . Hypertension   . Anxiety   . Rosacea    Past Surgical History  Procedure Laterality Date  . Knee surgery    . Appendectomy    . Tonsillectomy    . Shoulder surgery    . Cardiac catheterization  2007    Dr. Haroldine Summers   Current Outpatient Prescriptions on File Prior to Visit  Medication Sig Dispense Refill  . benazepril (LOTENSIN) 20 MG tablet TAKE 1 TABLET (20 MG TOTAL) BY MOUTH DAILY. (NEEDS OFFICE VISIT AND LABS BEFORE FURTHER REFILLS) 30 tablet 0  . minocycline (MINOCIN,DYNACIN) 100 MG capsule Take 1 capsule (100 mg total) by mouth 2 (two) times daily. (Needs office visit and labs before further refills) 60 capsule 0  . omeprazole (PRILOSEC) 20 MG capsule TAKE 2 CAPSULES BY MOUTH TWICE A DAY 180 capsule 1  . traMADol (ULTRAM) 50 MG tablet TAKE 1 TO 2 TABLETS BY MOUTH EVERY 6 HOURS AS NEEDED 60 tablet 0   No current facility-administered medications on file prior to visit.   No Known Allergies Social History   Social History  . Marital Status: Married    Spouse Name: N/A  . Number of Children: N/A  . Years of Education: N/A   Occupational History  . Not on file.   Social History Main Topics  . Smoking status: Never Smoker   . Smokeless tobacco: Never Used  . Alcohol Use: 8.4 oz/week    14 Cans of beer per week  . Drug Use: No  . Sexual Activity: Yes   Other Topics Concern  . 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   Does not smoke.   Drinks 2 beers each evening. Drinks about the same amount on the weekends. No wine or liquor.      Married with 4 children-- all of them are in the house.   He has a 55 year old son he is going [place] and then should be moving out of the house soon.   Has an 53 year old and he has 53 year old twins.      Review of Systems  All other systems reviewed and are negative.      Objective:   Physical Exam  Cardiovascular: Normal rate, regular rhythm and normal heart sounds.   Pulmonary/Chest: Effort normal and breath sounds normal. No respiratory distress. He has no wheezes. He has no rales.  Abdominal: Soft. Bowel sounds are normal. He exhibits no distension. There is no tenderness. There is no rebound and no guarding.  Musculoskeletal: He exhibits no edema.  Vitals reviewed.         Assessment & Plan:  Benign essential HTN - Plan: COMPLETE METABOLIC PANEL WITH GFR, Lipid panel, hydrochlorothiazide (HYDRODIURIL) 25 MG tablet  Blood pressures elevated. Continue benazepril but supplement with hydrochlorothiazide 25 mg by mouth daily. Recheck blood pressure in one  month. Check fasting lipid panel as well as liver function tests. Goal LDL cholesterol is less than 130.Marland Kitchen

## 2015-02-12 NOTE — Addendum Note (Signed)
Addended by: Shary Decamp B on: 02/12/2015 02:30 PM   Modules accepted: Orders

## 2015-02-17 ENCOUNTER — Encounter: Payer: Self-pay | Admitting: Family Medicine

## 2015-02-24 ENCOUNTER — Other Ambulatory Visit: Payer: Self-pay | Admitting: Family Medicine

## 2015-02-24 NOTE — Telephone Encounter (Signed)
ok 

## 2015-02-24 NOTE — Telephone Encounter (Signed)
?   OK to Refill tramadol

## 2015-02-24 NOTE — Telephone Encounter (Signed)
Medication refilled per protocol. 

## 2015-03-03 ENCOUNTER — Other Ambulatory Visit: Payer: Self-pay | Admitting: Family Medicine

## 2015-03-03 MED ORDER — FENOFIBRATE 160 MG PO TABS: 160.0000 mg | ORAL_TABLET | Freq: Every day | ORAL | Status: DC

## 2015-03-06 ENCOUNTER — Other Ambulatory Visit: Payer: Self-pay | Admitting: Family Medicine

## 2015-03-18 ENCOUNTER — Encounter: Payer: Self-pay | Admitting: Family Medicine

## 2015-03-18 ENCOUNTER — Ambulatory Visit (INDEPENDENT_AMBULATORY_CARE_PROVIDER_SITE_OTHER): Payer: Managed Care, Other (non HMO) | Admitting: Family Medicine

## 2015-03-18 VITALS — BP 130/76 | HR 76 | Temp 98.1°F | Resp 18 | Ht 73.0 in | Wt 237.0 lb

## 2015-03-18 DIAGNOSIS — I10 Essential (primary) hypertension: Secondary | ICD-10-CM

## 2015-03-18 DIAGNOSIS — M5431 Sciatica, right side: Secondary | ICD-10-CM | POA: Diagnosis not present

## 2015-03-18 MED ORDER — GABAPENTIN 300 MG PO CAPS: 300.0000 mg | ORAL_CAPSULE | Freq: Three times a day (TID) | ORAL | Status: DC

## 2015-03-18 MED ORDER — DICLOFENAC SODIUM 75 MG PO TBEC: 75.0000 mg | DELAYED_RELEASE_TABLET | Freq: Two times a day (BID) | ORAL | Status: DC

## 2015-03-18 NOTE — Progress Notes (Signed)
Subjective:    Patient ID: Vincent Summers, male    DOB: September 28, 1960, 54 y.o.   MRN: [id number]  Hypertension  02/12/15 Patient has a history of nonalcoholic fatty liver disease on CT scan. He is due to monitor his liver function tests. He is also due to recheck his cholesterol. His blood pressure today is elevated 152/80. He denies any chest pain shortness of breath or dyspnea on exertion. At that time, my plan was:  Blood pressures elevated. Continue benazepril but supplement with hydrochlorothiazide 25 mg by mouth daily. Recheck blood pressure in one month. Check fasting lipid panel as well as liver function tests. Goal LDL cholesterol is less than 130.Marland Kitchen  03/18/15 Blood pressure is much better today at 130/76. He denies any chest pain shortness of breath or dyspnea on exertion. However he does complain of pain radiating out of his lower back into his right gluteus and down his right posterior leg all the way to his foot. The pain is sharp and aching at times. At other times it is mild. It has been present for approximately 3 weeks. He also has a 5 mm brown wartlike papule on his left shoulder that produces a white waxy crust when scratched consistent with a seborrheic keratosis. Past Medical History  Diagnosis Date  . Arthritis   . GERD (gastroesophageal reflux disease)   . Hypercholesteremia   . Hypertension   . Anxiety   . Rosacea    Past Surgical History  Procedure Laterality Date  . Knee surgery    . Appendectomy    . Tonsillectomy    . Shoulder surgery    . Cardiac catheterization  2007    Dr. Haroldine Laws   Current Outpatient Prescriptions on File Prior to Visit  Medication Sig Dispense Refill  . ALPRAZolam (XANAX) 0.5 MG tablet Take 1 tablet (0.5 mg total) by mouth 3 (three) times daily as needed. 30 tablet 3  . benazepril (LOTENSIN) 20 MG tablet TAKE 1 TABLET (20 MG TOTAL) BY MOUTH DAILY. (NEEDS OFFICE VISIT AND LABS BEFORE FURTHER REFILLS) 30 tablet 0  . fenofibrate 160 MG  tablet Take 1 tablet (160 mg total) by mouth daily. 30 tablet 5  . hydrochlorothiazide (HYDRODIURIL) 25 MG tablet Take 1 tablet (25 mg total) by mouth daily. 90 tablet 3  . minocycline (MINOCIN,DYNACIN) 100 MG capsule TAKE ONE CAPSULE TWICE A DAY (NEEDS OFFICE VISIT AND LABS BEFORE FUTHER REFILLS) 60 capsule 0  . omeprazole (PRILOSEC) 20 MG capsule TAKE 2 CAPSULES BY MOUTH 2 TIMES A DAY 180 capsule 1  . traMADol (ULTRAM) 50 MG tablet TAKE 1 TO 2 TABLETS BY MOUTH EVERY 6 HOURS AS NEEDED FOR PAIN 60 tablet 0   No current facility-administered medications on file prior to visit.   No Known Allergies Social History   Social History  . Marital Status: Married    Spouse Name: N/A  . Number of Children: N/A  . Years of Education: N/A   Occupational History  . Not on file.   Social History Main Topics  . Smoking status: Never Smoker   . Smokeless tobacco: Never Used  . Alcohol Use: 8.4 oz/week    14 Cans of beer per week  . Drug Use: No  . Sexual Activity: Yes   Other Topics Concern  . 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   Does not smoke.  Drinks 2 beers each evening. Drinks about the same amount on the weekends. No wine or liquor.      Married with 4 children-- all of them are in the house.   He has a 30 year old son he is going [place] and then should be moving out of the house soon.   Has an 53 year old and he has 53 year old twins.      Review of Systems  All other systems reviewed and are negative.      Objective:   Physical Exam  Cardiovascular: Normal rate, regular rhythm and normal heart sounds.   Pulmonary/Chest: Effort normal and breath sounds normal. No respiratory distress. He has no wheezes. He has no rales.  Abdominal: Soft. Bowel sounds are normal. He exhibits no distension. There is no tenderness. There is no rebound and no guarding.  Musculoskeletal:  He exhibits no edema.  Vitals reviewed.         Assessment & Plan:  Right sided sciatica - Plan: diclofenac (VOLTAREN) 75 MG EC tablet, gabapentin (NEURONTIN) 300 MG capsule  Benign essential HTN blood pressure is excellent. I'll make no changes in his blood pressure medication. The 3 weeks of pain he has had in his lower back radiating down his right leg sound consistent with sciatica. Begin gabapentin 300 mg by mouth 3 times a day when necessary nerve pain and diclofenac 75 mg by mouth twice a day. If pain is not better in 3 weeks, proceed with an MRI of the lumbar spine.

## 2015-04-04 ENCOUNTER — Other Ambulatory Visit: Payer: Self-pay | Admitting: Family Medicine

## 2015-04-04 NOTE — Telephone Encounter (Signed)
ok 

## 2015-04-04 NOTE — Telephone Encounter (Signed)
Medication refilled per protocol. 

## 2015-04-04 NOTE — Telephone Encounter (Signed)
?   OK to Refill  

## 2015-05-28 ENCOUNTER — Other Ambulatory Visit: Payer: Self-pay | Admitting: Family Medicine

## 2015-05-28 NOTE — Telephone Encounter (Signed)
?   OK to Refill  

## 2015-05-29 NOTE — Telephone Encounter (Signed)
rx called in

## 2015-05-29 NOTE — Telephone Encounter (Signed)
ok 

## 2015-07-07 ENCOUNTER — Other Ambulatory Visit: Payer: Self-pay | Admitting: Family Medicine

## 2015-07-11 ENCOUNTER — Other Ambulatory Visit: Payer: Self-pay | Admitting: Family Medicine

## 2015-07-11 NOTE — Telephone Encounter (Signed)
Medication called to pharmacy. 

## 2015-07-11 NOTE — Telephone Encounter (Signed)
ok 

## 2015-07-11 NOTE — Telephone Encounter (Signed)
Ok to refill??  Last office visit 03/18/2015.  Last refill 05/29/2015.

## 2015-08-01 ENCOUNTER — Other Ambulatory Visit: Payer: Self-pay | Admitting: Family Medicine

## 2015-08-01 NOTE — Telephone Encounter (Signed)
Medication called to pharmacy. 

## 2015-08-01 NOTE — Telephone Encounter (Signed)
Ok to refill??  Last office visit 03/18/2015.  Last refill on Xanax 02/12/2015, #3 refills.   Last refill on Tramadol 07/11/2015.

## 2015-08-01 NOTE — Telephone Encounter (Signed)
ok 

## 2015-09-07 ENCOUNTER — Other Ambulatory Visit: Payer: Self-pay | Admitting: Family Medicine

## 2015-09-09 NOTE — Telephone Encounter (Signed)
ok 

## 2015-09-09 NOTE — Telephone Encounter (Signed)
LRF 08/01/15 #60  LOV 03/18/15  OK refill?

## 2015-09-17 NOTE — Telephone Encounter (Signed)
Wants refill on Tramadol

## 2015-09-17 NOTE — Telephone Encounter (Signed)
Okay to refill? 

## 2015-10-05 ENCOUNTER — Other Ambulatory Visit: Payer: Self-pay | Admitting: Family Medicine

## 2015-10-27 ENCOUNTER — Other Ambulatory Visit: Payer: Self-pay | Admitting: Family Medicine

## 2015-10-27 NOTE — Telephone Encounter (Signed)
Ok to refill 

## 2015-10-27 NOTE — Telephone Encounter (Signed)
ok 

## 2015-10-27 NOTE — Telephone Encounter (Signed)
Medication called to pharmacy. 

## 2015-12-05 ENCOUNTER — Other Ambulatory Visit: Payer: Self-pay | Admitting: Family Medicine

## 2015-12-05 NOTE — Telephone Encounter (Signed)
Ok to refill 

## 2015-12-08 NOTE — Telephone Encounter (Signed)
ok 

## 2015-12-09 ENCOUNTER — Encounter: Payer: Self-pay | Admitting: Family Medicine

## 2015-12-09 ENCOUNTER — Ambulatory Visit (INDEPENDENT_AMBULATORY_CARE_PROVIDER_SITE_OTHER): Payer: Managed Care, Other (non HMO) | Admitting: Family Medicine

## 2015-12-09 VITALS — BP 138/84 | HR 80 | Temp 98.1°F | Resp 16 | Ht 73.0 in | Wt 237.0 lb

## 2015-12-09 DIAGNOSIS — M4806 Spinal stenosis, lumbar region: Secondary | ICD-10-CM

## 2015-12-09 DIAGNOSIS — K219 Gastro-esophageal reflux disease without esophagitis: Secondary | ICD-10-CM | POA: Diagnosis not present

## 2015-12-09 DIAGNOSIS — M48061 Spinal stenosis, lumbar region without neurogenic claudication: Secondary | ICD-10-CM

## 2015-12-09 MED ORDER — ESOMEPRAZOLE MAGNESIUM 40 MG PO CPDR: 40.0000 mg | DELAYED_RELEASE_CAPSULE | Freq: Every day | ORAL | 3 refills | Status: DC

## 2015-12-09 MED ORDER — TRAMADOL HCL 50 MG PO TABS: 50.0000 mg | ORAL_TABLET | Freq: Four times a day (QID) | ORAL | 0 refills | Status: DC | PRN

## 2015-12-09 MED ORDER — PREDNISONE 20 MG PO TABS: ORAL_TABLET | ORAL | 0 refills | Status: DC

## 2015-12-09 NOTE — Progress Notes (Signed)
Subjective:    Patient ID: Vincent Summers, male    DOB: 13-Jun-1960, 55 y.o.   MRN: [id number]  HPI Patient is here today complaining of severe low back pain. The pain is located in his lumbar spine. It radiates into both hips posteriorly. The pain is described as a deep ache. It feels worse after prolonged standing and walking. It feels better if he leans forward or if he sits down or lies down. He denies any saddle anesthesia. He denies any claudication in his legs. He denies any radiculopathy. 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  Superimposed degenerative changes are present with mild spondylosis and mild facet degeneration.  Mild spinal stenosis at L2-3, L3-4, and L4-5.  However pain has worsened dramatically. The patient now states that he can only sit and drive for 5 minutes without having to stop and stand up and stretch his legs. However he is only able to stand for 10-15 minutes without having to sit down to relieve the pain and pressure in his lower back. Is very difficult for him to get comfortable. Past Medical History:  Diagnosis Date  . Anxiety   . Arthritis   . GERD (gastroesophageal reflux disease)   . Hypercholesteremia   . Hypertension   . Rosacea    Past Surgical History:  Procedure Laterality Date  . APPENDECTOMY    . CARDIAC CATHETERIZATION  2007   Dr. Haroldine Laws  . KNEE SURGERY    . SHOULDER SURGERY    . TONSILLECTOMY     Current Outpatient Prescriptions on File Prior to Visit  Medication Sig Dispense Refill  . ALPRAZolam (XANAX) 0.5 MG tablet TAKE 1 TABLET BY MOUTH 3 TIMES A DAY AS NEEDED 30 tablet 3  . benazepril (LOTENSIN) 20 MG tablet TAKE 1 TABLET BY MOUTH ONCE DAILY 90 tablet 1  . diclofenac (VOLTAREN) 75 MG EC tablet Take 1 tablet (75 mg total) by mouth 2 (two) times daily. 60 tablet 0  . fenofibrate 160 MG tablet TAKE 1 TABLET (160 MG TOTAL) BY MOUTH DAILY. 30 tablet 5  . gabapentin (NEURONTIN) 300 MG capsule  Take 1 capsule (300 mg total) by mouth 3 (three) times daily. 90 capsule 3  . hydrochlorothiazide (HYDRODIURIL) 25 MG tablet Take 1 tablet (25 mg total) by mouth daily. 90 tablet 3  . minocycline (MINOCIN,DYNACIN) 100 MG capsule Take 1 capsule (100 mg total) by mouth 2 (two) times daily. 180 capsule 1  . omeprazole (PRILOSEC) 20 MG capsule TAKE 2 CAPSULES BY MOUTH 2 TIMES A DAY 180 capsule 1   No current facility-administered medications on file prior to visit.    No Known Allergies Social History   Social History  . Marital status: Married    Spouse name: N/A  . Number of children: N/A  . Years of education: N/A   Occupational History  . Not on file.   Social History Main Topics  . Smoking status: Never Smoker  . Smokeless tobacco: Never Used  . Alcohol use 8.4 oz/week    14 Cans of beer per week  . Drug use: No  . Sexual activity: Yes   Other Topics Concern  . 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   Does not smoke.   Drinks 2 beers each evening. Drinks about the same amount on the weekends. No wine or  liquor.      Married with 4 children-- all of them are in the house.   He has a 15 year old son he is going [place] and then should be moving out of the house soon.   Has an 54 year old and he has 54 year old twins.      Review of Systems  All other systems reviewed and are negative.      Objective:   Physical Exam  Cardiovascular: Normal rate, regular rhythm and normal heart sounds.   Pulmonary/Chest: Effort normal and breath sounds normal.  Musculoskeletal:       Lumbar back: He exhibits decreased range of motion, tenderness, pain and spasm.  Vitals reviewed.         Assessment & Plan:  Gastroesophageal reflux disease without esophagitis - Plan: esomeprazole (NEXIUM) 40 MG capsule  Spinal stenosis of lumbar region - Plan: predniSONE (DELTASONE) 20 MG  tablet, traMADol (ULTRAM) 50 MG tablet, MR Lumbar Spine Wo Contrast  I believe the patient's spinal stenosis has worsened. I'll start the patient on a prednisone taper pack. I refilled his tramadol. Recheck in 2 weeks. I will also proceed with an MRI of the lumbar spine because I believe the patient may benefit from epidural steroid injections depending upon the severity of spinal stenosis

## 2015-12-26 ENCOUNTER — Other Ambulatory Visit: Payer: Self-pay | Admitting: Family Medicine

## 2015-12-26 NOTE — Telephone Encounter (Signed)
ok 

## 2015-12-26 NOTE — Telephone Encounter (Signed)
Ok to refill 

## 2016-01-01 ENCOUNTER — Ambulatory Visit
Admission: RE | Admit: 2016-01-01 | Discharge: 2016-01-01 | Disposition: A | Discharge status: Home / Self Care | Payer: Managed Care, Other (non HMO) | Source: Ambulatory Visit | Attending: Family Medicine | Admitting: Family Medicine

## 2016-01-01 DIAGNOSIS — M48061 Spinal stenosis, lumbar region without neurogenic claudication: Secondary | ICD-10-CM

## 2016-01-02 ENCOUNTER — Encounter: Payer: Self-pay | Admitting: Family Medicine

## 2016-01-08 ENCOUNTER — Other Ambulatory Visit: Payer: Self-pay | Admitting: Family Medicine

## 2016-01-08 DIAGNOSIS — M48061 Spinal stenosis, lumbar region without neurogenic claudication: Secondary | ICD-10-CM

## 2016-01-13 ENCOUNTER — Ambulatory Visit (INDEPENDENT_AMBULATORY_CARE_PROVIDER_SITE_OTHER): Payer: Managed Care, Other (non HMO)

## 2016-01-13 DIAGNOSIS — Z23 Encounter for immunization: Secondary | ICD-10-CM | POA: Diagnosis not present

## 2016-01-25 ENCOUNTER — Other Ambulatory Visit: Payer: Self-pay | Admitting: Family Medicine

## 2016-01-25 DIAGNOSIS — I10 Essential (primary) hypertension: Secondary | ICD-10-CM

## 2016-01-26 NOTE — Telephone Encounter (Signed)
ok 

## 2016-01-26 NOTE — Telephone Encounter (Signed)
Ok to refill 

## 2016-01-27 NOTE — Telephone Encounter (Signed)
ok 

## 2016-03-10 ENCOUNTER — Telehealth: Payer: Self-pay | Admitting: Family Medicine

## 2016-03-10 NOTE — Telephone Encounter (Signed)
Ok to refill 

## 2016-03-10 NOTE — Telephone Encounter (Signed)
Patient is asking for refill on his xanax  [phone]

## 2016-03-11 MED ORDER — ALPRAZOLAM 0.5 MG PO TABS: ORAL_TABLET | ORAL | 1 refills | Status: DC

## 2016-03-11 NOTE — Telephone Encounter (Signed)
Medication called/sent to requested pharmacy  

## 2016-03-11 NOTE — Telephone Encounter (Signed)
ok 

## 2016-04-04 ENCOUNTER — Other Ambulatory Visit: Payer: Self-pay | Admitting: Family Medicine

## 2016-04-05 NOTE — Telephone Encounter (Signed)
ok 

## 2016-04-05 NOTE — Telephone Encounter (Signed)
Ok to refill Tramadol.

## 2016-04-05 NOTE — Telephone Encounter (Signed)
Medication refilled per protocol.  Pt due to be seen

## 2016-04-15 ENCOUNTER — Other Ambulatory Visit: Payer: Self-pay | Admitting: Family Medicine

## 2016-04-16 NOTE — Telephone Encounter (Signed)
Medication called to pharmacy. 

## 2016-04-16 NOTE — Telephone Encounter (Signed)
ok 

## 2016-04-16 NOTE — Telephone Encounter (Signed)
Ok to refill 

## 2016-04-30 ENCOUNTER — Other Ambulatory Visit: Payer: Self-pay | Admitting: Family Medicine

## 2016-04-30 DIAGNOSIS — K219 Gastro-esophageal reflux disease without esophagitis: Secondary | ICD-10-CM

## 2016-05-03 ENCOUNTER — Encounter: Payer: Self-pay | Admitting: Physician Assistant

## 2016-05-03 ENCOUNTER — Ambulatory Visit (INDEPENDENT_AMBULATORY_CARE_PROVIDER_SITE_OTHER): Payer: Managed Care, Other (non HMO) | Admitting: Physician Assistant

## 2016-05-03 VITALS — BP 142/80 | HR 96 | Temp 98.2°F | Resp 18 | Wt 226.8 lb

## 2016-05-03 DIAGNOSIS — R52 Pain, unspecified: Secondary | ICD-10-CM | POA: Diagnosis not present

## 2016-05-03 DIAGNOSIS — J988 Other specified respiratory disorders: Secondary | ICD-10-CM | POA: Diagnosis not present

## 2016-05-03 DIAGNOSIS — B9689 Other specified bacterial agents as the cause of diseases classified elsewhere: Secondary | ICD-10-CM

## 2016-05-03 LAB — INFLUENZA A AND B AG, IMMUNOASSAY
INFLUENZA A ANTIGEN: NOT DETECTED
Influenza B Antigen: NOT DETECTED

## 2016-05-03 MED ORDER — AMOXICILLIN-POT CLAVULANATE 875-125 MG PO TABS: 1.0000 | ORAL_TABLET | Freq: Two times a day (BID) | ORAL | 0 refills | Status: DC

## 2016-05-03 NOTE — Progress Notes (Signed)
Patient ID: Vincent Summers MRN: [id number], DOB: 08/15/1960, 56 y.o. Date of Encounter: 05/03/2016, 12:12 PM    Chief Complaint:  Chief Complaint  Patient presents with  . Fever    x4days  . left ear pain  . Cough  . Headache  . Generalized Body Aches     HPI: 56 y.o. year old male presents with above.   Says that his symptoms started early Thursday morning which was 04/29/16. At that time developed diffuse body aches. Since then, developed head congestion, cough, left ear pain. He went to work Thursday and Friday. Stayed in bed Saturday and Sunday. Also painful when he coughs. Also feels pain in his left jaw region. "Even my teeth hurt ". " I feel hot then cold, hot and cold. Feel pounding behind my eyes." Only checked temperature once and that was on Friday at which time he got 101.8. Has no history of smoking.     Home Meds:   Outpatient Medications Prior to Visit  Medication Sig Dispense Refill  . ALPRAZolam (XANAX) 0.5 MG tablet TAKE 1 TABLET BY MOUTH 3 TIMES A DAY AS NEEDED FOR ANXIETY 30 tablet 0  . benazepril (LOTENSIN) 20 MG tablet TAKE 1 TABLET BY MOUTH ONCE DAILY 30 tablet 0  . diclofenac (VOLTAREN) 75 MG EC tablet Take 1 tablet (75 mg total) by mouth 2 (two) times daily. 60 tablet 0  . esomeprazole (NEXIUM) 40 MG capsule TAKE ONE CAPSULE BY MOUTH EVERY DAY 90 capsule 3  . fenofibrate 160 MG tablet TAKE 1 TABLET (160 MG TOTAL) BY MOUTH DAILY. 30 tablet 0  . gabapentin (NEURONTIN) 300 MG capsule Take 1 capsule (300 mg total) by mouth 3 (three) times daily. 90 capsule 3  . hydrochlorothiazide (HYDRODIURIL) 25 MG tablet TAKE 1 TABLET BY MOUTH EVERY DAY 90 tablet 3  . minocycline (MINOCIN,DYNACIN) 100 MG capsule Take 1 capsule (100 mg total) by mouth 2 (two) times daily. 180 capsule 1  . omeprazole (PRILOSEC) 20 MG capsule TAKE 2 CAPSULES BY MOUTH 2 TIMES A DAY 180 capsule 1  . traMADol (ULTRAM) 50 MG tablet TAKE 1 TO 2 TABLETS BY MOUTH EVERY 6 HOURS AS NEEDED 60 tablet 0   . predniSONE (DELTASONE) 20 MG tablet 3 tabs poqday 1-2, 2 tabs poqday 3-4, 1 tab poqday 5-6 (Patient not taking: Reported on 05/03/2016) 12 tablet 0   No facility-administered medications prior to visit.     Allergies: No Known Allergies    Review of Systems: See HPI for pertinent ROS. All other ROS negative.    Physical Exam: Blood pressure (!) 142/80, pulse 96, temperature 98.2 F (36.8 C), temperature source Oral, resp. rate 18, weight 226 lb 12.8 oz (102.9 kg), SpO2 99 %., Body mass index is 29.92 kg/m. General: WNWD WM.  Appears in no acute distress. HEENT: Normocephalic, atraumatic, eyes without discharge, sclera non-icteric, nares are without discharge. Right external ear and tympanic membrane normal. Left ear canal obstructed with cerumen. Cannot visualize left TM. Reports very mild tenderness with palpation of left TM joint. However reports significant tenderness with palpation of the left tonsillar node region. However states that he has no sore throat. Pharynx with minimal erythema. No exudate. No peritonsillar abscess.  Neck: Supple. No thyromegaly.  Lungs: Clear bilaterally to auscultation without wheezes, rales, or rhonchi. Breathing is unlabored. Heart: Regular rhythm. No murmurs, rubs, or gallops. Msk:  Strength and tone normal for age. Extremities/Skin: Warm and dry.  No rashes. Neuro: Alert and oriented  X 3. Moves all extremities spontaneously. Gait is normal. CNII-XII grossly in tact. Psych:  Responds to questions appropriately with a normal affect.   Results for orders placed or performed in visit on 05/03/16  Influenza A and B Ag, Immunoassay  Result Value Ref Range   Source: NASAL    Influenza A Antigen Not Detected Not Detected   Influenza B Antigen Not Detected Not Detected    ASSESSMENT AND PLAN:  56 y.o. year old male with      Patient ID: Vincent Summers MRN: [id number], DOB: 1960/04/07, 56 y.o. Date of Encounter: 05/03/2016, 1:46 PM    Chief  Complaint:  Chief Complaint  Patient presents with  . Fever    x4days  . left ear pain  . Cough  . Headache  . Generalized Body Aches     HPI: 56 y.o. year old male      Home Meds:   Outpatient Medications Prior to Visit  Medication Sig Dispense Refill  . ALPRAZolam (XANAX) 0.5 MG tablet TAKE 1 TABLET BY MOUTH 3 TIMES A DAY AS NEEDED FOR ANXIETY 30 tablet 0  . benazepril (LOTENSIN) 20 MG tablet TAKE 1 TABLET BY MOUTH ONCE DAILY 30 tablet 0  . diclofenac (VOLTAREN) 75 MG EC tablet Take 1 tablet (75 mg total) by mouth 2 (two) times daily. 60 tablet 0  . esomeprazole (NEXIUM) 40 MG capsule TAKE ONE CAPSULE BY MOUTH EVERY DAY 90 capsule 3  . fenofibrate 160 MG tablet TAKE 1 TABLET (160 MG TOTAL) BY MOUTH DAILY. 30 tablet 0  . gabapentin (NEURONTIN) 300 MG capsule Take 1 capsule (300 mg total) by mouth 3 (three) times daily. 90 capsule 3  . hydrochlorothiazide (HYDRODIURIL) 25 MG tablet TAKE 1 TABLET BY MOUTH EVERY DAY 90 tablet 3  . minocycline (MINOCIN,DYNACIN) 100 MG capsule Take 1 capsule (100 mg total) by mouth 2 (two) times daily. 180 capsule 1  . omeprazole (PRILOSEC) 20 MG capsule TAKE 2 CAPSULES BY MOUTH 2 TIMES A DAY 180 capsule 1  . traMADol (ULTRAM) 50 MG tablet TAKE 1 TO 2 TABLETS BY MOUTH EVERY 6 HOURS AS NEEDED 60 tablet 0  . predniSONE (DELTASONE) 20 MG tablet 3 tabs poqday 1-2, 2 tabs poqday 3-4, 1 tab poqday 5-6 (Patient not taking: Reported on 05/03/2016) 12 tablet 0   No facility-administered medications prior to visit.     Allergies: No Known Allergies    Review of Systems: See HPI for pertinent ROS. All other ROS negative.    Physical Exam: Blood pressure (!) 142/80, pulse 96, temperature 98.2 F (36.8 C), temperature source Oral, resp. rate 18, weight 226 lb 12.8 oz (102.9 kg), SpO2 99 %., Body mass index is 29.92 kg/m. General:  Appears in no acute distress. HEENT: Normocephalic, atraumatic, eyes without discharge, sclera non-icteric, nares are without  discharge. Bilateral auditory canals clear, TM's are without perforation, pearly grey and translucent with reflective cone of light bilaterally. Oral cavity moist, posterior pharynx without exudate, erythema, peritonsillar abscess, or post nasal drip. No tenderness with percussion of frontal or maxillary sinuses bilaterally. No lesion on oral mucosa, no dental abscess.   Neck: Supple. No thyromegaly. No lymphadenopathy. Lungs: Clear bilaterally to auscultation without wheezes, rales, or rhonchi. Breathing is unlabored. Heart: Regular rhythm. No murmurs, rubs, or gallops. Abdomen: Soft, non-tender, non-distended with normoactive bowel sounds. No hepatomegaly. No rebound/guarding. No obvious abdominal masses. Msk:  Strength and tone normal for age. Extremities/Skin: Warm and dry. No clubbing or cyanosis. No edema.  No rashes or suspicious lesions. Neuro: Alert and oriented X 3. Moves all extremities spontaneously. Gait is normal. CNII-XII grossly in tact. Psych:  Responds to questions appropriately with a normal affect.   Results for orders placed or performed in visit on 05/03/16  Influenza A and B Ag, Immunoassay  Result Value Ref Range   Source: NASAL    Influenza A Antigen Not Detected Not Detected   Influenza B Antigen Not Detected Not Detected     ASSESSMENT AND PLAN:  56 y.o. year old male with  1. Bacterial respiratory infection Even though flu test is coming back negative, suspect that he had influenza but am concerned about a secondary bacterial infection. Go ahead and cover this with Augmentin. Take as directed and complete all of it. Follow-up if symptoms worsen or if symptoms do not resolve upon completion of antibiotic. - amoxicillin-clavulanate (AUGMENTIN) 875-125 MG tablet; Take 1 tablet by mouth 2 (two) times daily.  Dispense: 20 tablet; Refill: 0  2. Body aches - Influenza A and B Ag, Immunoassay   Signed, [address] [place], [place], [place] 05/03/2016 1:46  PM     Signed, Karis Juba, PA, [facility] 05/03/2016 12:12 PM

## 2016-05-10 ENCOUNTER — Other Ambulatory Visit: Payer: Self-pay | Admitting: Family Medicine

## 2016-05-20 ENCOUNTER — Other Ambulatory Visit: Payer: Self-pay | Admitting: Family Medicine

## 2016-05-20 NOTE — Telephone Encounter (Signed)
Ok to refill 

## 2016-05-20 NOTE — Telephone Encounter (Signed)
Medication called to pharmacy. 

## 2016-05-20 NOTE — Telephone Encounter (Signed)
ok 

## 2016-05-28 ENCOUNTER — Other Ambulatory Visit: Payer: Self-pay | Admitting: Family Medicine

## 2016-05-28 NOTE — Telephone Encounter (Signed)
Ok to refill 

## 2016-05-28 NOTE — Telephone Encounter (Signed)
ok 

## 2016-06-09 ENCOUNTER — Other Ambulatory Visit: Payer: Self-pay | Admitting: Family Medicine

## 2016-06-24 ENCOUNTER — Other Ambulatory Visit: Payer: Self-pay | Admitting: Family Medicine

## 2016-06-24 NOTE — Telephone Encounter (Signed)
LRF Xanax 05/20/16 # 30  LFR  Tramadol 05/28/16  LOV 05/03/16   OK refill?

## 2016-06-24 NOTE — Telephone Encounter (Signed)
ok 

## 2016-06-25 NOTE — Telephone Encounter (Signed)
meds called in.

## 2016-07-06 ENCOUNTER — Encounter: Payer: Self-pay | Admitting: Family Medicine

## 2016-07-06 ENCOUNTER — Ambulatory Visit (INDEPENDENT_AMBULATORY_CARE_PROVIDER_SITE_OTHER): Payer: 59 | Admitting: Family Medicine

## 2016-07-06 VITALS — BP 122/74 | HR 60 | Temp 98.0°F | Resp 16 | Ht 73.0 in | Wt 221.0 lb

## 2016-07-06 DIAGNOSIS — R1011 Right upper quadrant pain: Secondary | ICD-10-CM

## 2016-07-06 DIAGNOSIS — L03012 Cellulitis of left finger: Secondary | ICD-10-CM | POA: Diagnosis not present

## 2016-07-06 MED ORDER — SULFAMETHOXAZOLE-TRIMETHOPRIM 800-160 MG PO TABS: 1.0000 | ORAL_TABLET | Freq: Two times a day (BID) | ORAL | 0 refills | Status: DC

## 2016-07-06 NOTE — Progress Notes (Signed)
Subjective:    Patient ID: Vincent Summers, male    DOB: July 07, 1960, 56 y.o.   MRN: [id number]  HPI  Patient presents with a 3 day history of redness and swelling and pain in his left fourth finger distal to the DIP joint. The redness is on the dorsum of the finger surrounding the fingernail. There is no visible paronychia. The redness is beginning to track down to the finger pad. He denies any puncture wounds or trauma. He denies any cuts. He denies any foreign bodies that could be retained.  Patient also has cellulitis. There is no visible fluctuance that can be drained at the present time. He also reports several week history of intermittent right upper quadrant pain. It will occur unprovoked. It can be moderate to intense. It was also spontaneously after a few hours. He reports nausea whenever the pain hits. He denies any constipation or blood in his stool or vomiting or hematemesis or fevers. He does have some loose stool occasionally. His exam today is unremarkable Past Medical History:  Diagnosis Date  . Anxiety   . Arthritis   . GERD (gastroesophageal reflux disease)   . Hypercholesteremia   . Hypertension   . Rosacea   . Spinal stenosis at L4-L5 level    Past Surgical History:  Procedure Laterality Date  . APPENDECTOMY    . CARDIAC CATHETERIZATION  2007   Dr. Haroldine Laws  . KNEE SURGERY    . SHOULDER SURGERY    . TONSILLECTOMY     Current Outpatient Prescriptions on File Prior to Visit  Medication Sig Dispense Refill  . ALPRAZolam (XANAX) 0.5 MG tablet TAKE 1 TABLET BY MOUTH THREE TIMES A DAY AS NEEDED 30 tablet 0  . benazepril (LOTENSIN) 20 MG tablet TAKE 1 TABLET BY MOUTH EVERY DAY 30 tablet 0  . esomeprazole (NEXIUM) 40 MG capsule TAKE ONE CAPSULE BY MOUTH EVERY DAY 90 capsule 3  . fenofibrate 160 MG tablet TAKE 1 TABLET BY MOUTH EVERY DAY 30 tablet 0  . hydrochlorothiazide (HYDRODIURIL) 25 MG tablet TAKE 1 TABLET BY MOUTH EVERY DAY 90 tablet 3  . minocycline  (MINOCIN,DYNACIN) 100 MG capsule Take 1 capsule (100 mg total) by mouth 2 (two) times daily. (Patient not taking: Reported on 07/06/2016) 180 capsule 1  . traMADol (ULTRAM) 50 MG tablet TAKE 1 TO 2 TABLETS BY MOUTH EVERY 6 HOURS AS NEEDED 60 tablet 0   No current facility-administered medications on file prior to visit.    No Known Allergies Social History   Social History  . Marital status: Married    Spouse name: N/A  . Number of children: N/A  . Years of education: N/A   Occupational History  . Not on file.   Social History Main Topics  . Smoking status: Never Smoker  . Smokeless tobacco: Never Used  . Alcohol use 8.4 oz/week    14 Cans of beer per week  . Drug use: No  . Sexual activity: Yes   Other Topics Concern  . 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   Does not smoke.   Drinks 2 beers each evening. Drinks about the same amount on the weekends. No wine or liquor.      Married with 4 children-- all of them are in the house.   He has a 55 year old son he is going [place] and then  should be moving out of the house soon.   Has an 55 year old and he has 55 year old twins.   Family History  Problem Relation Age of Onset  . Emphysema Mother   . Skin cancer Mother   . Leukemia Father   . Heart disease Father 27    CAD---CABG @ age 12  . Arrhythmia Father   . Arrhythmia Sister   . Stroke Maternal Grandmother 90  . Heart disease Maternal Grandfather   . Stroke Maternal Grandfather 44  . Hypertension Paternal Grandmother   . Stroke Paternal Grandmother 85  . Heart disease Paternal Grandfather 69     Review of Systems  All other systems reviewed and are negative.      Objective:   Physical Exam  Constitutional: He appears well-developed and well-nourished.  Cardiovascular: Normal rate, regular rhythm and normal heart sounds.   No murmur  heard. Pulmonary/Chest: Effort normal and breath sounds normal. No respiratory distress. He has no wheezes. He has no rales.  Abdominal: Soft. Bowel sounds are normal. He exhibits no distension and no mass. There is no tenderness. There is no rebound and no guarding.  Musculoskeletal:       Hands: Skin: Skin is warm. There is erythema.  Vitals reviewed.         Assessment & Plan:  Cellulitis of finger of left hand - Plan: sulfamethoxazole-trimethoprim (BACTRIM DS,SEPTRA DS) 800-160 MG tablet  RUQ abdominal pain - Plan: US Abdomen Limited RUQ  Patient also has cellulitis. There is no obvious paronychia or felon at the present time. Begin Bactrim double strength tablets by mouth twice a day for 10 days to cover possible staph. Recheck in 48 hours or immediately if worse. At the present time I see no evidence of a fluid collection that needs to be drained. If worsening, patient will likely need to see a hand surgeon. The right upper quadrant abdominal pain makes me suspicious for cholelithiasis. Obtain right upper quadrant ultrasound to evaluate further. Recheck in 48 hours or sooner if worse

## 2016-07-07 ENCOUNTER — Other Ambulatory Visit: Payer: Self-pay | Admitting: Family Medicine

## 2016-07-08 ENCOUNTER — Ambulatory Visit (INDEPENDENT_AMBULATORY_CARE_PROVIDER_SITE_OTHER): Payer: 59 | Admitting: Family Medicine

## 2016-07-08 VITALS — BP 130/72 | HR 80 | Temp 97.7°F | Resp 16 | Ht 73.0 in | Wt 218.0 lb

## 2016-07-08 DIAGNOSIS — L03012 Cellulitis of left finger: Secondary | ICD-10-CM | POA: Diagnosis not present

## 2016-07-08 NOTE — Progress Notes (Signed)
 Subjective:    Patient ID: Vincent Summers, male    DOB: 05/11/1960, 56 y.o.   MRN: [id number]  HPI 4/17 Patient presents with a 3 day history of redness and swelling and pain in his left fourth finger distal to the DIP joint. The redness is on the dorsum of the finger surrounding the fingernail. There is no visible paronychia. The redness is beginning to track down to the finger pad. He denies any puncture wounds or trauma. He denies any cuts. He denies any foreign bodies that could be retained.  Patient also has cellulitis. There is no visible fluctuance that can be drained at the present time. He also reports several week history of intermittent right upper quadrant pain. It will occur unprovoked. It can be moderate to intense. It was also spontaneously after a few hours. He reports nausea whenever the pain hits. He denies any constipation or blood in his stool or vomiting or hematemesis or fevers. He does have some loose stool occasionally. His exam today is unremarkable.  At that time, my plan was: Patient has cellulitis. There is no obvious paronychia or felon at the present time. Begin Bactrim double strength tablets by mouth twice a day for 10 days to cover possible staph. Recheck in 48 hours or immediately if worse. At the present time I see no evidence of a fluid collection that needs to be drained. If worsening, patient will likely need to see a hand surgeon. The right upper quadrant abdominal pain makes me suspicious for cholelithiasis. Obtain right upper quadrant ultrasound to evaluate further. Recheck in 48 hours or sooner if worse  07/08/16 Fortunately, the redness and swelling in his finger has improved noticeably on Bactrim. Unfortunately he ha now developed a paronychia near the proximal radial aspect of his fingernail.  This area is still tense red and tender with a small visible accumulation of pus adjacent to the fingernail Past Medical History:  Diagnosis Date  . Anxiety   .  Arthritis   . GERD (gastroesophageal reflux disease)   . Hypercholesteremia   . Hypertension   . Rosacea   . Spinal stenosis at L4-L5 level    Past Surgical History:  Procedure Laterality Date  . APPENDECTOMY    . CARDIAC CATHETERIZATION  2007   Dr. Bensimhon  . KNEE SURGERY    . SHOULDER SURGERY    . TONSILLECTOMY     Current Outpatient Prescriptions on File Prior to Visit  Medication Sig Dispense Refill  . ALPRAZolam (XANAX) 0.5 MG tablet TAKE 1 TABLET BY MOUTH THREE TIMES A DAY AS NEEDED 30 tablet 0  . benazepril (LOTENSIN) 20 MG tablet TAKE 1 TABLET BY MOUTH EVERY DAY 30 tablet 3  . esomeprazole (NEXIUM) 40 MG capsule TAKE ONE CAPSULE BY MOUTH EVERY DAY 90 capsule 3  . fenofibrate 160 MG tablet TAKE 1 TABLET BY MOUTH EVERY DAY 30 tablet 3  . hydrochlorothiazide (HYDRODIURIL) 25 MG tablet TAKE 1 TABLET BY MOUTH EVERY DAY 90 tablet 3  . sulfamethoxazole-trimethoprim (BACTRIM DS,SEPTRA DS) 800-160 MG tablet Take 1 tablet by mouth 2 (two) times daily. 20 tablet 0  . traMADol (ULTRAM) 50 MG tablet TAKE 1 TO 2 TABLETS BY MOUTH EVERY 6 HOURS AS NEEDED 60 tablet 0  . minocycline (MINOCIN,DYNACIN) 100 MG capsule Take 1 capsule (100 mg total) by mouth 2 (two) times daily. (Patient not taking: Reported on 07/06/2016) 180 capsule 1   No current facility-administered medications on file prior to visit.      No Known Allergies Social History   Social History  . Marital status: Married    Spouse name: N/A  . Number of children: N/A  . Years of education: N/A   Occupational History  . Not on file.   Social History Main Topics  . Smoking status: Never Smoker  . Smokeless tobacco: Never Used  . Alcohol use 8.4 oz/week    14 Cans of beer per week  . Drug use: No  . Sexual activity: Yes   Other Topics Concern  . 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   Does not smoke.   Drinks 2  beers each evening. Drinks about the same amount on the weekends. No wine or liquor.      Married with 4 children-- all of them are in the house.   He has a 40 year old son he is going [place] and then should be moving out of the house soon.   Has an 55 year old and he has 55 year old twins.   Family History  Problem Relation Age of Onset  . Emphysema Mother   . Skin cancer Mother   . Leukemia Father   . Heart disease Father 53    CAD---CABG @ age 69  . Arrhythmia Father   . Arrhythmia Sister   . Stroke Maternal Grandmother 90  . Heart disease Maternal Grandfather   . Stroke Maternal Grandfather 4  . Hypertension Paternal Grandmother   . Stroke Paternal Grandmother 85  . Heart disease Paternal Grandfather 70     Review of Systems  All other systems reviewed and are negative.      Objective:   Physical Exam  Constitutional: He appears well-developed and well-nourished.  Cardiovascular: Normal rate, regular rhythm and normal heart sounds.   No murmur heard. Pulmonary/Chest: Effort normal and breath sounds normal. No respiratory distress. He has no wheezes. He has no rales.  Abdominal: Soft. Bowel sounds are normal. He exhibits no distension and no mass. There is no tenderness. There is no rebound and no guarding.  Musculoskeletal:       Hands: Skin: Skin is warm. There is erythema.  Vitals reviewed.         Assessment & Plan:  Paronychia Only better however the paronychia requires incision and drainage. The finger was anesthetized with 0.1% lidocaine without epinephrine using a digital block. Attorney kit was applied. An incision was made in the skin directly over the accumulation of pus and purulent material was expressed. The wound was then cleaned thoroughly with hydrogen peroxide. The wound was then dressed with Neosporin, a Band-Aid, and Coban The accumulation of pus was approximately 2 mm by 4 mm. The incision was approximately 3 mm. Patient tolerated the procedure  well without complication

## 2016-07-09 ENCOUNTER — Other Ambulatory Visit: Payer: Self-pay | Admitting: Family Medicine

## 2016-07-09 ENCOUNTER — Ambulatory Visit (HOSPITAL_COMMUNITY)
Admission: RE | Admit: 2016-07-09 | Discharge: 2016-07-09 | Disposition: A | Discharge status: Home / Self Care | Payer: 59 | Source: Ambulatory Visit | Attending: Family Medicine | Admitting: Family Medicine

## 2016-07-09 ENCOUNTER — Encounter: Payer: Self-pay | Admitting: Family Medicine

## 2016-07-09 DIAGNOSIS — K769 Liver disease, unspecified: Secondary | ICD-10-CM

## 2016-07-09 DIAGNOSIS — R1011 Right upper quadrant pain: Secondary | ICD-10-CM

## 2016-07-14 ENCOUNTER — Encounter: Payer: Self-pay | Admitting: Family Medicine

## 2016-07-19 ENCOUNTER — Ambulatory Visit (HOSPITAL_COMMUNITY)
Admission: RE | Admit: 2016-07-19 | Discharge: 2016-07-19 | Disposition: A | Discharge status: Home / Self Care | Payer: 59 | Source: Ambulatory Visit | Attending: Family Medicine | Admitting: Family Medicine

## 2016-07-19 DIAGNOSIS — K7689 Other specified diseases of liver: Secondary | ICD-10-CM | POA: Diagnosis not present

## 2016-07-19 DIAGNOSIS — K769 Liver disease, unspecified: Secondary | ICD-10-CM | POA: Diagnosis present

## 2016-07-19 DIAGNOSIS — N281 Cyst of kidney, acquired: Secondary | ICD-10-CM | POA: Diagnosis not present

## 2016-07-19 DIAGNOSIS — R932 Abnormal findings on diagnostic imaging of liver and biliary tract: Secondary | ICD-10-CM | POA: Insufficient documentation

## 2016-07-19 LAB — POCT I-STAT CREATININE: Creatinine, Ser: 1 mg/dL (ref 0.61–1.24)

## 2016-07-19 MED ORDER — GADOBENATE DIMEGLUMINE 529 MG/ML IV SOLN
20.0000 mL | Freq: Once | INTRAVENOUS | Status: AC | PRN
  Administered 2016-07-19: 20 mL via INTRAVENOUS

## 2016-07-20 ENCOUNTER — Encounter: Payer: Self-pay | Admitting: Family Medicine

## 2016-07-23 ENCOUNTER — Other Ambulatory Visit: Payer: Self-pay | Admitting: Family Medicine

## 2016-07-23 NOTE — Telephone Encounter (Signed)
Ok to refill both?? 

## 2016-07-23 NOTE — Telephone Encounter (Signed)
Medication called to pharmacy. 

## 2016-07-23 NOTE — Telephone Encounter (Signed)
ok 

## 2016-07-23 NOTE — Telephone Encounter (Signed)
To MD

## 2016-07-25 ENCOUNTER — Other Ambulatory Visit: Payer: Managed Care, Other (non HMO)

## 2016-08-20 ENCOUNTER — Other Ambulatory Visit: Payer: Self-pay | Admitting: Family Medicine

## 2016-08-20 NOTE — Telephone Encounter (Signed)
rx called into pharmacy

## 2016-08-20 NOTE — Telephone Encounter (Signed)
Ok to refill 

## 2016-08-20 NOTE — Telephone Encounter (Signed)
ok 

## 2016-08-23 ENCOUNTER — Encounter: Payer: Self-pay | Admitting: Family Medicine

## 2016-09-09 ENCOUNTER — Other Ambulatory Visit: Payer: Self-pay | Admitting: Family Medicine

## 2016-09-09 NOTE — Telephone Encounter (Signed)
ok 

## 2016-09-09 NOTE — Telephone Encounter (Signed)
Medication called to pharmacy. 

## 2016-09-09 NOTE — Telephone Encounter (Signed)
Ok to refill 

## 2016-10-21 ENCOUNTER — Other Ambulatory Visit: Payer: Self-pay | Admitting: *Deleted

## 2016-10-21 ENCOUNTER — Other Ambulatory Visit: Payer: Self-pay | Admitting: Family Medicine

## 2016-10-22 NOTE — Telephone Encounter (Signed)
ok 

## 2016-10-22 NOTE — Telephone Encounter (Signed)
Ok to refill 

## 2016-11-09 ENCOUNTER — Other Ambulatory Visit: Payer: Self-pay | Admitting: Family Medicine

## 2016-11-15 ENCOUNTER — Other Ambulatory Visit: Payer: Self-pay | Admitting: Family Medicine

## 2016-11-29 ENCOUNTER — Encounter: Payer: Self-pay | Admitting: Family Medicine

## 2016-12-05 ENCOUNTER — Other Ambulatory Visit: Payer: Self-pay | Admitting: Family Medicine

## 2016-12-06 ENCOUNTER — Other Ambulatory Visit: Payer: Self-pay | Admitting: Family Medicine

## 2016-12-06 NOTE — Telephone Encounter (Signed)
ok 

## 2016-12-06 NOTE — Telephone Encounter (Signed)
New Message   *STAT* If patient is at the pharmacy, call can be transferred to refill team.   1. Which medications need to be refilled? (please list name of each medication and dose if known)  alprazolam .5 mg tablet three times daily as needed for anxiety  2. Which pharmacy/location (including street and city if local pharmacy) is medication to be sent to? [facility] [address] [place], [place]  3. Do they need a 30 day or 90 day supply?  30 day supply  Pts wife voiced pt is leaving today to do out of state at 3 pm.

## 2016-12-06 NOTE — Telephone Encounter (Signed)
Last OV 4/19 Last refill 8/3 Okay to refill?

## 2016-12-06 NOTE — Telephone Encounter (Signed)
Ok to refill 

## 2016-12-06 NOTE — Telephone Encounter (Signed)
Rx request for Alprazolam 0.5 mg Last OV 07/08/2016 Next OV: none Last refilled 10/22/2016 Please advise

## 2016-12-07 MED ORDER — ALPRAZOLAM 0.5 MG PO TABS: 0.5000 mg | ORAL_TABLET | Freq: Three times a day (TID) | ORAL | 2 refills | Status: DC | PRN

## 2016-12-07 NOTE — Telephone Encounter (Signed)
Medication called to pharmacy. 

## 2016-12-07 NOTE — Telephone Encounter (Signed)
ok 

## 2016-12-08 ENCOUNTER — Other Ambulatory Visit: Payer: Self-pay | Admitting: Family Medicine

## 2016-12-08 NOTE — Telephone Encounter (Signed)
Medication refilled already.

## 2016-12-27 ENCOUNTER — Encounter: Payer: Self-pay | Admitting: Family Medicine

## 2017-01-25 ENCOUNTER — Encounter: Payer: Self-pay | Admitting: Family Medicine

## 2017-01-28 ENCOUNTER — Other Ambulatory Visit: Payer: Self-pay | Admitting: Family Medicine

## 2017-01-28 DIAGNOSIS — I10 Essential (primary) hypertension: Secondary | ICD-10-CM

## 2017-01-28 NOTE — Telephone Encounter (Signed)
Medication refill for one time only.  Patient needs to be seen.  Letter sent for patient to call and schedule 

## 2017-02-23 ENCOUNTER — Encounter: Payer: Self-pay | Admitting: Family Medicine

## 2017-02-26 ENCOUNTER — Other Ambulatory Visit: Payer: Self-pay | Admitting: Family Medicine

## 2017-02-27 ENCOUNTER — Other Ambulatory Visit: Payer: Self-pay | Admitting: Family Medicine

## 2017-02-27 DIAGNOSIS — I10 Essential (primary) hypertension: Secondary | ICD-10-CM

## 2017-03-09 ENCOUNTER — Other Ambulatory Visit: Payer: Self-pay | Admitting: Family Medicine

## 2017-03-09 DIAGNOSIS — I10 Essential (primary) hypertension: Secondary | ICD-10-CM

## 2017-03-09 MED ORDER — BENAZEPRIL HCL 20 MG PO TABS: 20.0000 mg | ORAL_TABLET | Freq: Every day | ORAL | 0 refills | Status: DC

## 2017-03-09 MED ORDER — HYDROCHLOROTHIAZIDE 25 MG PO TABS: 25.0000 mg | ORAL_TABLET | Freq: Every day | ORAL | 0 refills | Status: DC

## 2017-04-06 ENCOUNTER — Other Ambulatory Visit: Payer: Self-pay | Admitting: Family Medicine

## 2017-04-06 DIAGNOSIS — I10 Essential (primary) hypertension: Secondary | ICD-10-CM

## 2017-04-08 ENCOUNTER — Telehealth: Payer: Self-pay | Admitting: Family Medicine

## 2017-04-08 DIAGNOSIS — I10 Essential (primary) hypertension: Secondary | ICD-10-CM

## 2017-04-08 MED ORDER — BENAZEPRIL HCL 20 MG PO TABS: 20.0000 mg | ORAL_TABLET | Freq: Every day | ORAL | 0 refills | Status: DC

## 2017-04-08 MED ORDER — HYDROCHLOROTHIAZIDE 25 MG PO TABS: 25.0000 mg | ORAL_TABLET | Freq: Every day | ORAL | 0 refills | Status: DC

## 2017-04-08 NOTE — Telephone Encounter (Signed)
pt has been discharged from our practice d/t not meeting financial obligations - he was wanting to get one more refill while looking for another doctor as he was not aware he was discharged - per him. Per Dr. Dennard Schaumann ok to refill x 1 month. Pt aware and med sent to pharm.

## 2017-04-14 ENCOUNTER — Other Ambulatory Visit: Payer: Self-pay | Admitting: Family Medicine

## 2017-04-14 DIAGNOSIS — I10 Essential (primary) hypertension: Secondary | ICD-10-CM

## 2017-04-15 ENCOUNTER — Other Ambulatory Visit: Payer: Self-pay | Admitting: Family Medicine

## 2017-04-15 DIAGNOSIS — I10 Essential (primary) hypertension: Secondary | ICD-10-CM

## 2017-04-15 MED ORDER — BENAZEPRIL HCL 20 MG PO TABS: 20.0000 mg | ORAL_TABLET | Freq: Every day | ORAL | 0 refills | Status: DC

## 2017-04-15 MED ORDER — HYDROCHLOROTHIAZIDE 25 MG PO TABS: 25.0000 mg | ORAL_TABLET | Freq: Every day | ORAL | 0 refills | Status: DC

## 2017-05-13 ENCOUNTER — Other Ambulatory Visit: Payer: Self-pay | Admitting: Family Medicine

## 2017-05-13 DIAGNOSIS — I10 Essential (primary) hypertension: Secondary | ICD-10-CM

## 2017-05-26 ENCOUNTER — Other Ambulatory Visit: Payer: Self-pay | Admitting: Family Medicine

## 2017-05-26 DIAGNOSIS — K219 Gastro-esophageal reflux disease without esophagitis: Secondary | ICD-10-CM

## 2017-07-19 DIAGNOSIS — H6123 Impacted cerumen, bilateral: Secondary | ICD-10-CM | POA: Insufficient documentation

## 2017-07-19 DIAGNOSIS — H903 Sensorineural hearing loss, bilateral: Secondary | ICD-10-CM | POA: Insufficient documentation

## 2017-07-22 DIAGNOSIS — M48062 Spinal stenosis, lumbar region with neurogenic claudication: Secondary | ICD-10-CM | POA: Insufficient documentation

## 2017-07-22 DIAGNOSIS — M5416 Radiculopathy, lumbar region: Secondary | ICD-10-CM | POA: Insufficient documentation

## 2018-01-02 ENCOUNTER — Telehealth: Payer: Self-pay | Admitting: Internal Medicine

## 2018-01-02 DIAGNOSIS — F411 Generalized anxiety disorder: Secondary | ICD-10-CM | POA: Insufficient documentation

## 2018-01-02 NOTE — Telephone Encounter (Signed)
MD out of office. Will forward to him to f/u tomorrow 10/15

## 2018-01-02 NOTE — Telephone Encounter (Signed)
  Dr. Melford Aase would like to speak with Dr Debara Pickett regarding their mutual paitent

## 2018-01-03 NOTE — Telephone Encounter (Signed)
Returned call to Dr. Melford Aase- he will send patient back for follow-up as needed.  Dr. Lemmie Evens

## 2018-04-26 ENCOUNTER — Other Ambulatory Visit: Payer: Self-pay | Admitting: Gastroenterology

## 2018-04-26 DIAGNOSIS — K760 Fatty (change of) liver, not elsewhere classified: Secondary | ICD-10-CM

## 2018-05-01 ENCOUNTER — Ambulatory Visit
Admission: RE | Admit: 2018-05-01 | Discharge: 2018-05-01 | Disposition: A | Discharge status: Home / Self Care | Payer: Managed Care, Other (non HMO) | Source: Ambulatory Visit | Attending: Gastroenterology | Admitting: Gastroenterology

## 2018-05-01 DIAGNOSIS — K760 Fatty (change of) liver, not elsewhere classified: Secondary | ICD-10-CM

## 2018-11-09 ENCOUNTER — Ambulatory Visit: Payer: Self-pay | Admitting: Surgery

## 2018-11-09 NOTE — H&P (Signed)
History of Present Illness Vincent Summers. Vincent Dingley MD; 11/09/2018 3:58 PM) The patient is a 58 year old male who presents with an abdominal wall hernia. Referred by Dr. Melford Aase for ventral hernia  This is a 58 year old male who presents with a ventral hernia. The patient underwent laparoscopic appendectomy in 2008. Over the last 2 years he has developed increasing bulging and discomfort above his umbilicus. He has noticed some changes in his bowel movements but he has never been completely obstructed. Recently he underwent a CT scan of the abdomen and pelvis at [facility]. We do not have the images available but we do have the report. This shows a small umbilical hernia containing fat. He also has a moderate-sized supraumbilical ventral hernia containing fat only. We will try to obtain those images. He is referred to [place] for surgical evaluation.   Problem List/Past Medical Vincent Key K. 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 Shoulder Surgery Bilateral. Tonsillectomy Vasectomy  Diagnostic Studies History (Vincent Summers, [place]; 11/09/2018 3:06 PM) Colonoscopy 5-10 years ago  Allergies (Vincent Summers, [place]; 11/09/2018 3:06 PM) No Known Drug Allergies [11/09/2018]: Allergies Reconciled  Medication History (Vincent Summers, [place]; 11/09/2018 3:08 PM) Benazepril HCl (40MG  Tablet, Oral) Active. Esomeprazole Magnesium (20MG  Packet, Oral) Active. hydroCHLOROthiazide (25MG  Tablet, Oral) Active. Sildenafil Citrate (20MG  Tablet, Oral) Active. ALPRAZolam (0.5MG  Tablet, Oral) Active. Minocycline HCl (100MG  Tablet, Oral) Active. Gabapentin (300MG  Tablet, Oral) Active. Medications Reconciled  Social History (Vincent Summers, [place]; 11/09/2018 3:06 PM) Alcohol use Moderate alcohol use. Caffeine use Carbonated beverages. No drug  use Tobacco use Never smoker.  Family History (Vincent Summers, [place]; 11/09/2018 3:06 PM) Arthritis Mother, Sister. Bleeding disorder Sister. Depression Daughter. Heart Disease Father, Sister. Heart disease in male family member before age 54 Heart disease in male family member before age 67 Hypertension Father, Mother, Sister. Melanoma Father, Mother, Sister. Migraine Headache Daughter, Sister. Respiratory Condition Mother. Thyroid problems Mother.  Other Problems Vincent Summers. Yamaris Cummings, MD; 11/09/2018 3:58 PM) Anxiety Disorder Arthritis Back Pain Gastroesophageal Reflux Disease High blood pressure Hypercholesterolemia Umbilical Hernia Repair Ventral Hernia Repair     Review of Systems (Vincent A. Brown RMA; 11/09/2018 3:06 PM) General Present- Appetite Loss, Night Sweats and Weight Loss. Not Present- Chills, Fatigue, Fever and Weight Gain. Skin Not Present- Change in Wart/Mole, Dryness, Hives, Jaundice, New Lesions, Non-Healing Wounds, Rash and Ulcer. HEENT Present- Hearing Loss, Ringing in the Ears and Sinus Pain. Not Present- Earache, Hoarseness, Nose Bleed, Oral Ulcers, Seasonal Allergies, Sore Throat, Visual Disturbances, Wears glasses/contact lenses and Yellow Eyes. Breast Not Present- Breast Mass, Breast Pain, Nipple Discharge and Skin Changes. Cardiovascular Present- Palpitations. Not Present- Chest Pain, Difficulty Breathing Lying Down, Leg Cramps, Rapid Heart Rate, Shortness of Breath and Swelling of Extremities. Gastrointestinal Present- Abdominal Pain, Bloating, Change in Bowel Habits, Constipation, Gets full quickly at meals, Nausea and Vomiting. Not Present- Bloody Stool, Chronic diarrhea, Difficulty Swallowing, Excessive gas, Hemorrhoids, Indigestion and Rectal Pain. Musculoskeletal Present- Back Pain, Joint Pain and Joint Stiffness. Not Present- Muscle Pain, Muscle Weakness and Swelling of Extremities. Neurological Not Present- Decreased Memory,  Fainting, Headaches, Numbness, Seizures, Tingling, Tremor, Trouble walking and Weakness. Psychiatric Present- Anxiety. Not Present- Bipolar, Change in Sleep Pattern, Depression, Fearful and Frequent crying. Endocrine Not Present- Cold Intolerance, Excessive Hunger, Hair Changes, Heat Intolerance, Hot flashes and New Diabetes. Hematology Not Present- Blood Thinners, Easy Bruising, Excessive bleeding,  Gland problems, HIV and Persistent Infections.  Vitals (Vincent A. Brown RMA; 11/09/2018 3:06 PM) 11/09/2018 3:06 PM Weight: 242.4 lb Height: 73in Body Surface Area: 2.34 m Body Mass Index: 31.98 kg/m  Temp.: 97.15F  Pulse: 84 (Regular)  BP: 124/82 (Sitting, Left Arm, Standard)        Physical Exam Vincent Key K. Crew Goren MD; 11/09/2018 4:00 PM)  The physical exam findings are as follows: Note:WDWN in NAD Eyes: Pupils equal, round; sclera anicteric HENT: Oral mucosa moist; good dentition Neck: No masses palpated, no thyromegaly Lungs: CTA bilaterally; normal respiratory effort CV: Regular rate and rhythm; no murmurs; extremities well-perfused with no edema Abd: +bowel sounds, soft, non-tender, no palpable organomegaly; healed umbilical scar The patient has at least 3 palpable bulges beginning at the upper edge of the umbilicus and extending superiorly into the left side of the abdomen - partially reducible Skin: Warm, dry; no sign of jaundice Psychiatric - alert and oriented x 4; calm mood and affect    Assessment & Plan Vincent Key K. 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 The surgical procedure has been discussed with the patient. Potential risks, benefits, alternative treatments, and expected outcomes have been explained. All of the patient's questions at this time have been answered. The likelihood of reaching the patient's treatment  goal is good. The patient understand the proposed surgical procedure and wishes to proceed.  Vincent Summers. Georgette Dover, MD, [facility] Surgery  General/ Trauma Surgery Beeper [phone]  11/09/2018 4:00 PM

## 2018-11-09 NOTE — H&P (View-Only) (Signed)
History of Present Illness Vincent Summers. Vincent Shives Summers; 11/09/2018 3:58 PM) The patient is a 58 year old male who presents with an abdominal wall hernia. Referred by Dr. Melford Aase for ventral hernia  This is a 58 year old male who presents with a ventral hernia. The patient underwent laparoscopic appendectomy in 2008. Over the last 2 years he has developed increasing bulging and discomfort above his umbilicus. He has noticed some changes in his bowel movements but he has never been completely obstructed. Recently he underwent a CT scan of the abdomen and pelvis at [facility]. We do not have the images available but we do have the report. This shows a small umbilical hernia containing fat. He also has a moderate-sized supraumbilical ventral hernia containing fat only. We will try to obtain those images. He is referred to [place] for surgical evaluation.   Problem List/Past Medical Vincent Key K. 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 Shoulder Surgery Bilateral. Tonsillectomy Vasectomy  Diagnostic Studies History (Vincent Summers, [place]; 11/09/2018 3:06 PM) Colonoscopy 5-10 years ago  Allergies (Vincent Summers, [place]; 11/09/2018 3:06 PM) No Known Drug Allergies [11/09/2018]: Allergies Reconciled  Medication History (Vincent Summers, [place]; 11/09/2018 3:08 PM) Benazepril HCl (40MG  Tablet, Oral) Active. Esomeprazole Magnesium (20MG  Packet, Oral) Active. hydroCHLOROthiazide (25MG  Tablet, Oral) Active. Sildenafil Citrate (20MG  Tablet, Oral) Active. ALPRAZolam (0.5MG  Tablet, Oral) Active. Minocycline HCl (100MG  Tablet, Oral) Active. Gabapentin (300MG  Tablet, Oral) Active. Medications Reconciled  Social History (Vincent Summers, [place]; 11/09/2018 3:06 PM) Alcohol use Moderate alcohol use. Caffeine use Carbonated beverages. No drug  use Tobacco use Never smoker.  Family History (Vincent Summers, [place]; 11/09/2018 3:06 PM) Arthritis Mother, Sister. Bleeding disorder Sister. Depression Daughter. Heart Disease Father, Sister. Heart disease in male family member before age 74 Heart disease in male family member before age 34 Hypertension Father, Mother, Sister. Melanoma Father, Mother, Sister. Migraine Headache Daughter, Sister. Respiratory Condition Mother. Thyroid problems Mother.  Other Problems Vincent Summers. Vincent Widdowson, Summers; 11/09/2018 3:58 PM) Anxiety Disorder Arthritis Back Pain Gastroesophageal Reflux Disease High blood pressure Hypercholesterolemia Umbilical Hernia Repair Ventral Hernia Repair     Review of Systems (Vincent Summers; 11/09/2018 3:06 PM) General Present- Appetite Loss, Night Sweats and Weight Loss. Not Present- Chills, Fatigue, Fever and Weight Gain. Skin Not Present- Change in Wart/Mole, Dryness, Hives, Jaundice, New Lesions, Non-Healing Wounds, Rash and Ulcer. HEENT Present- Hearing Loss, Ringing in the Ears and Sinus Pain. Not Present- Earache, Hoarseness, Nose Bleed, Oral Ulcers, Seasonal Allergies, Sore Throat, Visual Disturbances, Wears glasses/contact lenses and Yellow Eyes. Breast Not Present- Breast Mass, Breast Pain, Nipple Discharge and Skin Changes. Cardiovascular Present- Palpitations. Not Present- Chest Pain, Difficulty Breathing Lying Down, Leg Cramps, Rapid Heart Rate, Shortness of Breath and Swelling of Extremities. Gastrointestinal Present- Abdominal Pain, Bloating, Change in Bowel Habits, Constipation, Gets full quickly at meals, Nausea and Vomiting. Not Present- Bloody Stool, Chronic diarrhea, Difficulty Swallowing, Excessive gas, Hemorrhoids, Indigestion and Rectal Pain. Musculoskeletal Present- Back Pain, Joint Pain and Joint Stiffness. Not Present- Muscle Pain, Muscle Weakness and Swelling of Extremities. Neurological Not Present- Decreased Memory,  Fainting, Headaches, Numbness, Seizures, Tingling, Tremor, Trouble walking and Weakness. Psychiatric Present- Anxiety. Not Present- Bipolar, Change in Sleep Pattern, Depression, Fearful and Frequent crying. Endocrine Not Present- Cold Intolerance, Excessive Hunger, Hair Changes, Heat Intolerance, Hot flashes and New Diabetes. Hematology Not Present- Blood Thinners, Easy Bruising, Excessive bleeding,  Gland problems, HIV and Persistent Infections.  Vitals (Vincent Summers; 11/09/2018 3:06 PM) 11/09/2018 3:06 PM Weight: 242.4 lb Height: 73in Body Surface Area: 2.34 m Body Mass Index: 31.98 kg/m  Temp.: 97.46F  Pulse: 84 (Regular)  BP: 124/82 (Sitting, Left Arm, Standard)        Physical Exam Vincent Key K. Sendy Pluta Summers; 11/09/2018 4:00 PM)  The physical exam findings are as follows: Note:WDWN in NAD Eyes: Pupils equal, round; sclera anicteric HENT: Oral mucosa moist; good dentition Neck: No masses palpated, no thyromegaly Lungs: CTA bilaterally; normal respiratory effort CV: Regular rate and rhythm; no murmurs; extremities well-perfused with no edema Abd: +bowel sounds, soft, non-tender, no palpable organomegaly; healed umbilical scar The patient has at least 3 palpable bulges beginning at the upper edge of the umbilicus and extending superiorly into the left side of the abdomen - partially reducible Skin: Warm, dry; no sign of jaundice Psychiatric - alert and oriented x 4; calm mood and affect    Assessment & Plan Vincent Key K. 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 The surgical procedure has been discussed with the patient. Potential risks, benefits, alternative treatments, and expected outcomes have been explained. All of the patient's questions at this time have been answered. The likelihood of reaching the patient's treatment  goal is good. The patient understand the proposed surgical procedure and wishes to proceed.  Vincent Summers, [facility] Surgery  General/ Trauma Surgery Beeper [phone]  11/09/2018 4:00 PM

## 2018-11-21 NOTE — Pre-Procedure Instructions (Signed)
Vincent Summers  11/21/2018     Your procedure is scheduled on Tuesday, September 8.  Report to [facility], Main Entrance or Entrance "A" at  7:30 AM                Your surgery or procedure is scheduled for 9:30 A.M.   Call this number if you have problems the morning of surgery: [phone]  This is the number for the Pre- Surgical Desk.          For any other questions prior to surgery Monday - Friday, 8:00 AM - 4:00 PM, call [phone] PAT desk, ask to speak to any nurse.   Remember:  Do not eat  after midnight Monday, September 7..  You may drink clear liquids until 6:30 AM.   Clear liquids allowed are:  Water, Juice (non-citric and without pulp), Carbonated beverages, Clear Tea, Black Coffee only, Plain Jell-O only, Gatorade and Plain Popsicles only    Take these medicines the morning of surgery with A SIP OF WATER:              esomeprazole (NEXIUM)              gabapentin (NEURONTIN)  May take if needed: acetaminophen (TYLENOL)  ALPRAZolam Duanne Moron)  Special instructions:  [place]- Preparing For Surgery  Before surgery, you can play an important role. Because skin is not sterile, your skin needs to be as free of germs as possible. You can reduce the number of germs on your skin by washing with CHG (chlorahexidine gluconate) Soap before surgery.  CHG is an antiseptic cleaner which kills germs and bonds with the skin to continue killing germs even after washing.    Oral Hygiene is also important to reduce your risk of infection.  Remember - BRUSH YOUR TEETH THE MORNING OF SURGERY WITH YOUR REGULAR TOOTHPASTE  Please do not use if you have an allergy to CHG or antibacterial soaps. If your skin becomes reddened/irritated stop using the CHG.  Do not shave (including legs and underarms) for at least 48 hours prior to first CHG shower. It is OK to shave your face.  Please follow these instructions carefully.   1. Shower the NIGHT BEFORE SURGERY and the MORNING  OF SURGERY with CHG.   2. If you chose to wash your hair, wash your hair first as usual with your normal shampoo.  3. After you shampoo, wash your face and private area with the soap you use at home, then rinse your hair and body thoroughly to remove the shampoo and soap.  4. Use CHG as you would any other liquid soap. You can apply CHG directly to the skin and wash gently with a scrungie or a clean washcloth.   5. Apply the CHG Soap to your body ONLY FROM THE NECK DOWN.  Do not use on open wounds or open sores. Avoid contact with your eyes, ears, mouth and genitals (private parts).    6. Wash thoroughly, paying special attention to the area where your surgery will be performed.  7. Thoroughly rinse your body with warm water from the neck down.  8. DO NOT shower/wash with your normal soap after using and rinsing off the CHG Soap.  9. Pat yourself dry with a CLEAN TOWEL.  10. Wear CLEAN PAJAMAS to bed the night before surgery, wear comfortable clothes the morning of surgery  11. Place CLEAN SHEETS on your bed the night of your first shower  and DO NOT SLEEP WITH PETS.  Day of Surgery: Shower as instructed above. Do not apply any deodorants/lotions, powders or colognes.  Please wear clean clothes to the hospital/surgery center.   Remember to brush your teeth WITH YOUR REGULAR TOOTHPASTE.  Do not wear jewelry, make-up or nail polish.             Men may shave face and neck.  Do not bring valuables to the hospital.  [facility] is not responsible for any belongings or valuables.  Contacts, dentures or bridgework may not be worn into surgery.  Leave your suitcase in the car.  After surgery it may be brought to your room.  For patients admitted to the hospital, discharge time will be determined by your treatment team.  Patients discharged the day of surgery will not be allowed to drive home.   Please read over the following fact sheets that you were given: Pain Booklet, Coughing and  Deep Breathing, Surgical Site Infections.

## 2018-11-22 ENCOUNTER — Encounter (HOSPITAL_COMMUNITY): Payer: Self-pay

## 2018-11-22 ENCOUNTER — Other Ambulatory Visit: Payer: Self-pay

## 2018-11-22 ENCOUNTER — Encounter (HOSPITAL_COMMUNITY)
Admission: RE | Admit: 2018-11-22 | Discharge: 2018-11-22 | Disposition: A | Discharge status: Home / Self Care | Payer: BC Managed Care – PPO | Source: Ambulatory Visit | Attending: Surgery | Admitting: Surgery

## 2018-11-22 DIAGNOSIS — Z01812 Encounter for preprocedural laboratory examination: Secondary | ICD-10-CM | POA: Insufficient documentation

## 2018-11-22 HISTORY — DX: Personal history of other diseases of the digestive system: Z87.19

## 2018-11-22 HISTORY — DX: Palpitations: R00.2

## 2018-11-22 HISTORY — DX: Fatty (change of) liver, not elsewhere classified: K76.0

## 2018-11-22 LAB — COMPREHENSIVE METABOLIC PANEL
ALT: 41 U/L (ref 0–44)
AST: 29 U/L (ref 15–41)
Albumin: 3.9 g/dL (ref 3.5–5.0)
Alkaline Phosphatase: 87 U/L (ref 38–126)
Anion gap: 10 (ref 5–15)
BUN: 11 mg/dL (ref 6–20)
CO2: 28 mmol/L (ref 22–32)
Calcium: 9.4 mg/dL (ref 8.9–10.3)
Chloride: 99 mmol/L (ref 98–111)
Creatinine, Ser: 0.89 mg/dL (ref 0.61–1.24)
GFR calc Af Amer: >60 mL/min (ref >60)
GFR calc non Af Amer: >60 mL/min (ref >60)
Glucose, Bld: 110 mg/dL — ABNORMAL HIGH (ref 70–99)
Potassium: 4.1 mmol/L (ref 3.5–5.1)
Sodium: 137 mmol/L (ref 135–145)
Total Bilirubin: 0.7 mg/dL (ref 0.3–1.2)
Total Protein: 6.4 g/dL — ABNORMAL LOW (ref 6.5–8.1)

## 2018-11-22 LAB — CBC
HCT: 42.3 % (ref 39.0–52.0)
Hemoglobin: 14.6 g/dL (ref 13.0–17.0)
MCH: 31.3 pg (ref 26.0–34.0)
MCHC: 34.5 g/dL (ref 30.0–36.0)
MCV: 90.6 fL (ref 80.0–100.0)
Platelets: 210 10*3/uL (ref 150–400)
RBC: 4.67 MIL/uL (ref 4.22–5.81)
RDW: 12.7 % (ref 11.5–15.5)
WBC: 6.7 10*3/uL (ref 4.0–10.5)
nRBC: 0 % (ref 0.0–0.2)

## 2018-11-22 NOTE — Progress Notes (Signed)
PCP - Dr. Melford Aase  Cardiologist - Dr. Debara Pickett  Chest x-ray - Denies  EKG - 12/1417 (CE)- Req'd  Stress Test - 04/02/14 (E)  ECHO - 08/18/05 (E)  Cardiac Cath - 2007 (E)  AICD- na PM-na LOOP-na  Sleep Study - Denies CPAP - None  LABS- 11/22/2018: CBC, CMP  ASA- Denies  ERAS- Yes- no drink  Anesthesia- Yes, req'd EKG  Pt denies having chest pain, sob, or fever at this time. All instructions explained to the pt, with a verbal understanding of the material. Pt agrees to go over the instructions while at home for a better understanding. Pt also instructed to self quarantine after being tested for COVID-19. The opportunity to ask questions was provided.   Coronavirus Screening  Have you experienced the following symptoms:  Cough yes/no: No Fever (>100.50F)  yes/no: No Runny nose yes/no: No Sore throat yes/no: No Difficulty breathing/shortness of breath  yes/no: No  Have you or a family member traveled in the last 14 days and where? yes/no: No   If the patient indicates "YES" to the above questions, their PAT will be rescheduled to limit the exposure to others and, the surgeon will be notified. THE PATIENT WILL NEED TO BE ASYMPTOMATIC FOR 14 DAYS.   If the patient is not experiencing any of these symptoms, the PAT nurse will instruct them to NOT bring anyone with them to their appointment since they may have these symptoms or traveled as well.   Please remind your patients and families that hospital visitation restrictions are in effect and the importance of the restrictions.

## 2018-11-22 NOTE — Progress Notes (Signed)
Your procedure is scheduled on Tuesday, September 8.            Report to [facility], Main Entrance or Entrance "A" at  7:30 AM                Your surgery or procedure is scheduled for 9:30 A.M.             Call this number if you have problems the morning of surgery: [phone]  This is the number for the Pre- Surgical Desk.                    For any other questions prior to surgery Monday - Friday, 8:00 AM - 4:00 PM, call [phone] PAT desk, ask to speak to any nurse.             Remember:            Do not eat  after midnight Monday, September 7..            You may drink clear liquids until 6:30 AM.   Clear liquids allowed are:  Water, Juice (non-citric and without pulp), Carbonated beverages, Clear Tea, Black Coffee only, Plain Jell-O only, Gatorade and Plain Popsicles only                        Take these medicines the morning of surgery with A SIP OF WATER:  Esomeprazole (NEXIUM)  Gabapentin (NEURONTIN)   If needed: Acetaminophen (TYLENOL)         ALPRAZolam (XANAX)  As of today, stop taking all Aspirin (unless instructed by your doctor) and Other Aspirin containing products, Vitamins, Fish oils, and Herbal medications. Also stop all NSAIDS i.e. Advil, Ibuprofen, Motrin, Aleve, Anaprox, Naproxen, BC, Goody Powders, and all Supplements.  Special instructions:  [place]- Preparing For Surgery  Before surgery, you can play an important role. Because skin is not sterile, your skin needs to be as free of germs as possible. You can reduce the number of germs on your skin by washing with CHG (chlorahexidine gluconate) Soap before surgery.  CHG is an antiseptic cleaner which kills germs and bonds with the skin to continue killing germs even after washing.    Please do not use if you have an allergy to CHG or antibacterial soaps. If your skin becomes reddened/irritated stop using the CHG.  Do not shave (including legs and underarms) for at least 48 hours prior  to first CHG shower. It is OK to shave your face.  Please follow these instructions carefully.                                                                                                                     1. Shower the NIGHT BEFORE SURGERY and the MORNING OF SURGERY with CHG.   2. If you chose to wash your hair, wash your hair first as usual with your normal shampoo.  3. After you shampoo,  wash your face and private area with the soap you use at home, then rinse your hair and body thoroughly to remove the shampoo and soap.  4. Use CHG as you would any other liquid soap. You can apply CHG directly to the skin and wash gently with a scrungie or a clean washcloth.   5. Apply the CHG Soap to your body ONLY FROM THE NECK DOWN.  Do not use on open wounds or open sores. Avoid contact with your eyes, ears, mouth and genitals (private parts).    6. Wash thoroughly, paying special attention to the area where your surgery will be performed.  7. Thoroughly rinse your body with warm water from the neck down.  8. DO NOT shower/wash with your normal soap after using and rinsing off the CHG Soap.  9. Pat yourself dry with a CLEAN TOWEL.  10. Wear CLEAN PAJAMAS to bed the night before surgery, wear comfortable clothes the morning of surgery  11. Place CLEAN SHEETS on your bed the night of your first shower and DO NOT SLEEP WITH PETS.  Day of Surgery: Remember to brush your teeth WITH YOUR REGULAR TOOTHPASTE.             Do not wear jewelry. Do not apply any deodorants,lotions, powders or colognes.              Men may shave face and neck.            Do not bring valuables to the hospital.            [facility] is not responsible for any belongings or valuables.  Contacts, dentures or bridgework may not be worn into surgery.    For patients admitted to the hospital, discharge time will be determined by your treatment team.  Patients discharged the day of surgery will not be  allowed to drive home.   Please wear clean clothes to the hospital/surgery center.  Please read over the following fact sheets that you were given: Pain Booklet, Coughing and Deep Breathing, Surgical Site Infections.

## 2018-11-23 NOTE — Anesthesia Preprocedure Evaluation (Addendum)
Anesthesia Evaluation  Patient identified by MRN, date of birth, ID band Patient awake    Reviewed: Allergy & Precautions, NPO status , Patient's Chart, lab work & pertinent test results  Airway Mallampati: II  TM Distance: >3 FB Neck ROM: Full    Dental no notable dental hx.    Pulmonary neg pulmonary ROS,    Pulmonary exam normal breath sounds clear to auscultation       Cardiovascular hypertension, negative cardio ROS Normal cardiovascular exam Rhythm:Regular Rate:Normal     Neuro/Psych Anxiety negative neurological ROS  negative psych ROS   GI/Hepatic Neg liver ROS, GERD  ,  Endo/Other  negative endocrine ROS  Renal/GU negative Renal ROS  negative genitourinary   Musculoskeletal  (+) Arthritis , Osteoarthritis,    Abdominal (+) + obese,   Peds negative pediatric ROS (+)  Hematology negative hematology ROS (+)   Anesthesia Other Findings   Reproductive/Obstetrics negative OB ROS                            Anesthesia Physical Anesthesia Plan  ASA: II  Anesthesia Plan: General   Post-op Pain Management:    Induction: Intravenous  PONV Risk Score and Plan: 2 and Ondansetron, Midazolam and Treatment may vary due to age or medical condition  Airway Management Planned: Oral ETT  Additional Equipment:   Intra-op Plan:   Post-operative Plan: Extubation in OR  Informed Consent: I have reviewed the patients History and Physical, chart, labs and discussed the procedure including the risks, benefits and alternatives for the proposed anesthesia with the patient or authorized representative who has indicated his/her understanding and acceptance.     Dental advisory given  Plan Discussed with: CRNA  Anesthesia Plan Comments: (Evaluated in 2016 by Dr. Debara Pickett for atypical chest pain, per note "Atypical chest pain - negative exercise stress test, Zero calcium score.Marland KitchenMarland KitchenI'm happy to see  him back in the future on an as-needed basis. His studies are reassuring that he currently is at low risk for coronary disease."  EKG 01/02/18 (copy on chart): Sinus rhythm. Rate 65.  Exercise tolerance test 04/02/2014: Comments:  The patient had an good exercise tolerance. There was no chest  pain. There was an appropriate level of dyspnea. There were no  arrhythmias, a normal heart rate response an accelerated BP  response. There were no ischemic ST T wave changes and a normal  heart rate recovery.   Recommendations:  No diagnostic EKG changes. There was a hypertensive BP response.   CT calcium 04/02/14: IMPRESSION: Coronary calcium score of 0. This was 0 percentile for age and sex matched control.   )       Anesthesia Quick Evaluation

## 2018-11-24 ENCOUNTER — Other Ambulatory Visit (HOSPITAL_COMMUNITY)
Admission: RE | Admit: 2018-11-24 | Discharge: 2018-11-24 | Disposition: A | Discharge status: Home / Self Care | Payer: BC Managed Care – PPO | Source: Ambulatory Visit | Attending: Surgery | Admitting: Surgery

## 2018-11-24 DIAGNOSIS — Z01812 Encounter for preprocedural laboratory examination: Secondary | ICD-10-CM | POA: Insufficient documentation

## 2018-11-24 DIAGNOSIS — Z20828 Contact with and (suspected) exposure to other viral communicable diseases: Secondary | ICD-10-CM | POA: Insufficient documentation

## 2018-11-25 LAB — NOVEL CORONAVIRUS, NAA (HOSP ORDER, SEND-OUT TO REF LAB; TAT 18-24 HRS): SARS-CoV-2, NAA: NOT DETECTED

## 2018-11-28 ENCOUNTER — Encounter (HOSPITAL_COMMUNITY): Payer: Self-pay | Admitting: *Deleted

## 2018-11-28 ENCOUNTER — Ambulatory Visit (HOSPITAL_COMMUNITY)
Admission: RE | Admit: 2018-11-28 | Discharge: 2018-11-28 | Disposition: A | Discharge status: Home / Self Care | Payer: BC Managed Care – PPO | Attending: Surgery | Admitting: Surgery

## 2018-11-28 ENCOUNTER — Ambulatory Visit (HOSPITAL_COMMUNITY): Payer: BC Managed Care – PPO | Admitting: Anesthesiology

## 2018-11-28 ENCOUNTER — Ambulatory Visit (HOSPITAL_COMMUNITY): Payer: BC Managed Care – PPO | Admitting: Physician Assistant

## 2018-11-28 ENCOUNTER — Encounter (HOSPITAL_COMMUNITY)
Admission: RE | Disposition: A | Discharge status: Home / Self Care | Payer: Self-pay | Source: Home / Self Care | Attending: Surgery

## 2018-11-28 ENCOUNTER — Other Ambulatory Visit: Payer: Self-pay

## 2018-11-28 DIAGNOSIS — M1990 Unspecified osteoarthritis, unspecified site: Secondary | ICD-10-CM | POA: Insufficient documentation

## 2018-11-28 DIAGNOSIS — E669 Obesity, unspecified: Secondary | ICD-10-CM | POA: Insufficient documentation

## 2018-11-28 DIAGNOSIS — K219 Gastro-esophageal reflux disease without esophagitis: Secondary | ICD-10-CM | POA: Diagnosis not present

## 2018-11-28 DIAGNOSIS — Z8349 Family history of other endocrine, nutritional and metabolic diseases: Secondary | ICD-10-CM | POA: Insufficient documentation

## 2018-11-28 DIAGNOSIS — Z6831 Body mass index (BMI) 31.0-31.9, adult: Secondary | ICD-10-CM | POA: Diagnosis not present

## 2018-11-28 DIAGNOSIS — I10 Essential (primary) hypertension: Secondary | ICD-10-CM | POA: Diagnosis not present

## 2018-11-28 DIAGNOSIS — Z809 Family history of malignant neoplasm, unspecified: Secondary | ICD-10-CM | POA: Insufficient documentation

## 2018-11-28 DIAGNOSIS — M549 Dorsalgia, unspecified: Secondary | ICD-10-CM | POA: Insufficient documentation

## 2018-11-28 DIAGNOSIS — Z832 Family history of diseases of the blood and blood-forming organs and certain disorders involving the immune mechanism: Secondary | ICD-10-CM | POA: Diagnosis not present

## 2018-11-28 DIAGNOSIS — Z8249 Family history of ischemic heart disease and other diseases of the circulatory system: Secondary | ICD-10-CM | POA: Insufficient documentation

## 2018-11-28 DIAGNOSIS — Z8261 Family history of arthritis: Secondary | ICD-10-CM | POA: Diagnosis not present

## 2018-11-28 DIAGNOSIS — Z836 Family history of other diseases of the respiratory system: Secondary | ICD-10-CM | POA: Insufficient documentation

## 2018-11-28 DIAGNOSIS — Z79899 Other long term (current) drug therapy: Secondary | ICD-10-CM | POA: Diagnosis not present

## 2018-11-28 DIAGNOSIS — K432 Incisional hernia without obstruction or gangrene: Secondary | ICD-10-CM | POA: Diagnosis present

## 2018-11-28 DIAGNOSIS — F419 Anxiety disorder, unspecified: Secondary | ICD-10-CM | POA: Diagnosis not present

## 2018-11-28 DIAGNOSIS — E7800 Pure hypercholesterolemia, unspecified: Secondary | ICD-10-CM | POA: Insufficient documentation

## 2018-11-28 HISTORY — PX: VENTRAL HERNIA REPAIR: SHX424

## 2018-11-28 HISTORY — PX: INSERTION OF MESH: SHX5868

## 2018-11-28 SURGERY — REPAIR, HERNIA, VENTRAL, LAPAROSCOPIC
Anesthesia: General | Site: Abdomen

## 2018-11-28 MED ORDER — GABAPENTIN 300 MG PO CAPS
300.0000 mg | ORAL_CAPSULE | ORAL | Status: DC
  Filled 2018-11-28: qty 1

## 2018-11-28 MED ORDER — LACTATED RINGERS IV SOLN
INTRAVENOUS | Status: DC
  Administered 2018-11-28: 1000 mL via INTRAVENOUS

## 2018-11-28 MED ORDER — FENTANYL CITRATE (PF) 250 MCG/5ML IJ SOLN
INTRAMUSCULAR | Status: AC
  Filled 2018-11-28: qty 5

## 2018-11-28 MED ORDER — CHLORHEXIDINE GLUCONATE CLOTH 2 % EX PADS: 6.0000 | MEDICATED_PAD | Freq: Once | CUTANEOUS | Status: DC

## 2018-11-28 MED ORDER — BUPIVACAINE-EPINEPHRINE 0.25% -1:200000 IJ SOLN
INTRAMUSCULAR | Status: DC | PRN
  Administered 2018-11-28: 10 mL

## 2018-11-28 MED ORDER — OXYCODONE HCL 5 MG PO TABS
ORAL_TABLET | ORAL | Status: AC
  Filled 2018-11-28: qty 1

## 2018-11-28 MED ORDER — OXYCODONE HCL 5 MG/5ML PO SOLN: 5.0000 mg | Freq: Once | ORAL | Status: AC | PRN

## 2018-11-28 MED ORDER — ONDANSETRON HCL 4 MG/2ML IJ SOLN
INTRAMUSCULAR | Status: DC | PRN
  Administered 2018-11-28: 4 mg via INTRAVENOUS

## 2018-11-28 MED ORDER — PROPOFOL 10 MG/ML IV BOLUS
INTRAVENOUS | Status: AC
  Filled 2018-11-28: qty 20

## 2018-11-28 MED ORDER — PROMETHAZINE HCL 25 MG/ML IJ SOLN: 6.2500 mg | INTRAMUSCULAR | Status: DC | PRN

## 2018-11-28 MED ORDER — PROPOFOL 10 MG/ML IV BOLUS
INTRAVENOUS | Status: DC | PRN
  Administered 2018-11-28: 180 mg via INTRAVENOUS

## 2018-11-28 MED ORDER — FENTANYL CITRATE (PF) 250 MCG/5ML IJ SOLN
INTRAMUSCULAR | Status: DC | PRN
  Administered 2018-11-28: 50 ug via INTRAVENOUS
  Administered 2018-11-28: 100 ug via INTRAVENOUS
  Administered 2018-11-28 (×2): 50 ug via INTRAVENOUS

## 2018-11-28 MED ORDER — SUGAMMADEX SODIUM 200 MG/2ML IV SOLN
INTRAVENOUS | Status: DC | PRN
  Administered 2018-11-28: 200 mg via INTRAVENOUS

## 2018-11-28 MED ORDER — MEPERIDINE HCL 25 MG/ML IJ SOLN: 6.2500 mg | INTRAMUSCULAR | Status: DC | PRN

## 2018-11-28 MED ORDER — 0.9 % SODIUM CHLORIDE (POUR BTL) OPTIME
TOPICAL | Status: DC | PRN
  Administered 2018-11-28: 1000 mL

## 2018-11-28 MED ORDER — HYDROMORPHONE HCL 1 MG/ML IJ SOLN: 0.2500 mg | INTRAMUSCULAR | Status: DC | PRN

## 2018-11-28 MED ORDER — KETOROLAC TROMETHAMINE 30 MG/ML IJ SOLN
INTRAMUSCULAR | Status: DC | PRN
  Administered 2018-11-28: 30 mg via INTRAVENOUS

## 2018-11-28 MED ORDER — MIDAZOLAM HCL 2 MG/2ML IJ SOLN
INTRAMUSCULAR | Status: AC
  Filled 2018-11-28: qty 2

## 2018-11-28 MED ORDER — OXYCODONE HCL 5 MG PO TABS: 5.0000 mg | ORAL_TABLET | Freq: Four times a day (QID) | ORAL | 0 refills | Status: DC | PRN

## 2018-11-28 MED ORDER — LIDOCAINE 2% (20 MG/ML) 5 ML SYRINGE
INTRAMUSCULAR | Status: DC | PRN
  Administered 2018-11-28: 80 mg via INTRAVENOUS

## 2018-11-28 MED ORDER — BUPIVACAINE-EPINEPHRINE (PF) 0.25% -1:200000 IJ SOLN
INTRAMUSCULAR | Status: AC
  Filled 2018-11-28: qty 30

## 2018-11-28 MED ORDER — CEFAZOLIN SODIUM-DEXTROSE 2-4 GM/100ML-% IV SOLN
2.0000 g | INTRAVENOUS | Status: AC
  Administered 2018-11-28: 2 g via INTRAVENOUS
  Filled 2018-11-28: qty 100

## 2018-11-28 MED ORDER — DEXAMETHASONE SODIUM PHOSPHATE 10 MG/ML IJ SOLN
INTRAMUSCULAR | Status: DC | PRN
  Administered 2018-11-28: 10 mg via INTRAVENOUS

## 2018-11-28 MED ORDER — STERILE WATER FOR IRRIGATION IR SOLN
Status: DC | PRN
  Administered 2018-11-28: 1000 mL

## 2018-11-28 MED ORDER — MIDAZOLAM HCL 2 MG/2ML IJ SOLN
INTRAMUSCULAR | Status: DC | PRN
  Administered 2018-11-28: 2 mg via INTRAVENOUS

## 2018-11-28 MED ORDER — OXYCODONE HCL 5 MG PO TABS
5.0000 mg | ORAL_TABLET | Freq: Once | ORAL | Status: AC | PRN
  Administered 2018-11-28: 5 mg via ORAL

## 2018-11-28 MED ORDER — ROCURONIUM BROMIDE 10 MG/ML (PF) SYRINGE
PREFILLED_SYRINGE | INTRAVENOUS | Status: DC | PRN
  Administered 2018-11-28: 20 mg via INTRAVENOUS
  Administered 2018-11-28: 50 mg via INTRAVENOUS
  Administered 2018-11-28: 20 mg via INTRAVENOUS

## 2018-11-28 MED ORDER — ACETAMINOPHEN 500 MG PO TABS
1000.0000 mg | ORAL_TABLET | ORAL | Status: AC
  Administered 2018-11-28: 1000 mg via ORAL
  Filled 2018-11-28: qty 2

## 2018-11-28 SURGICAL SUPPLY — 44 items
APPLIER CLIP 5 13 M/L LIGAMAX5 (MISCELLANEOUS)
BINDER ABDOMINAL 12 ML 46-62 (SOFTGOODS) ×2 IMPLANT
BLADE CLIPPER SURG (BLADE) IMPLANT
CANISTER SUCT 3000ML PPV (MISCELLANEOUS) IMPLANT
CHLORAPREP W/TINT 26 (MISCELLANEOUS) ×3 IMPLANT
CLIP APPLIE 5 13 M/L LIGAMAX5 (MISCELLANEOUS) IMPLANT
COVER SURGICAL LIGHT HANDLE (MISCELLANEOUS) ×3 IMPLANT
DERMABOND ADVANCED (GAUZE/BANDAGES/DRESSINGS) ×2
DERMABOND ADVANCED .7 DNX12 (GAUZE/BANDAGES/DRESSINGS) ×1 IMPLANT
DEVICE RELIATACK FIXATION (MISCELLANEOUS) IMPLANT
DEVICE SECURE STRAP 25 ABSORB (INSTRUMENTS) ×2 IMPLANT
DEVICE TROCAR PUNCTURE CLOSURE (ENDOMECHANICALS) ×3 IMPLANT
ELECT REM PT RETURN 9FT ADLT (ELECTROSURGICAL) ×3
ELECTRODE REM PT RTRN 9FT ADLT (ELECTROSURGICAL) ×1 IMPLANT
FILTER SMOKE EVAC LAPAROSHD (FILTER) ×2 IMPLANT
GLOVE BIO SURGEON STRL SZ7 (GLOVE) ×7 IMPLANT
GLOVE BIOGEL PI IND STRL 7.5 (GLOVE) ×1 IMPLANT
GLOVE BIOGEL PI INDICATOR 7.5 (GLOVE) ×4
GOWN STRL REUS W/ TWL LRG LVL3 (GOWN DISPOSABLE) ×3 IMPLANT
GOWN STRL REUS W/TWL LRG LVL3 (GOWN DISPOSABLE) ×10
KIT BASIN OR (CUSTOM PROCEDURE TRAY) ×3 IMPLANT
KIT TURNOVER KIT B (KITS) ×3 IMPLANT
MARKER SKIN DUAL TIP RULER LAB (MISCELLANEOUS) ×3 IMPLANT
MESH VENTRALIGHT ST 4.5IN (Mesh General) ×2 IMPLANT
NDL SPNL 22GX3.5 QUINCKE BK (NEEDLE) ×1 IMPLANT
NEEDLE SPNL 22GX3.5 QUINCKE BK (NEEDLE) ×3 IMPLANT
NS IRRIG 1000ML POUR BTL (IV SOLUTION) ×3 IMPLANT
PAD ARMBOARD 7.5X6 YLW CONV (MISCELLANEOUS) ×6 IMPLANT
POUCH SPECIMEN RETRIEVAL 10MM (ENDOMECHANICALS) ×2 IMPLANT
SCISSORS LAP 5X35 DISP (ENDOMECHANICALS) IMPLANT
SET IRRIG TUBING LAPAROSCOPIC (IRRIGATION / IRRIGATOR) IMPLANT
SET TUBE SMOKE EVAC HIGH FLOW (TUBING) ×3 IMPLANT
SHEARS HARMONIC ACE PLUS 36CM (ENDOMECHANICALS) ×2 IMPLANT
SLEEVE ENDOPATH XCEL 5M (ENDOMECHANICALS) ×3 IMPLANT
SUT MNCRL AB 4-0 PS2 18 (SUTURE) ×3 IMPLANT
SUT NOVA NAB GS-21 0 18 T12 DT (SUTURE) ×5 IMPLANT
TOWEL GREEN STERILE (TOWEL DISPOSABLE) ×3 IMPLANT
TOWEL GREEN STERILE FF (TOWEL DISPOSABLE) ×3 IMPLANT
TRAY FOLEY MTR SLVR 14FR STAT (SET/KITS/TRAYS/PACK) IMPLANT
TRAY LAPAROSCOPIC MC (CUSTOM PROCEDURE TRAY) ×3 IMPLANT
TROCAR XCEL BLUNT TIP 100MML (ENDOMECHANICALS) IMPLANT
TROCAR XCEL NON-BLD 11X100MML (ENDOMECHANICALS) ×3 IMPLANT
TROCAR XCEL NON-BLD 5MMX100MML (ENDOMECHANICALS) ×3 IMPLANT
WATER STERILE IRR 1000ML POUR (IV SOLUTION) ×3 IMPLANT

## 2018-11-28 NOTE — Op Note (Signed)
Laparoscopic Ventral Hernia Repair with mesh Procedure Note  Indications: This is a 58 year old male who presents with a ventral hernia. The patient underwent laparoscopic appendectomy in 2008. Over the last 2 years he has developed increasing bulging and discomfort above his umbilicus. He has noticed some changes in his bowel movements but he has never been completely obstructed. Recently he underwent a CT scan of the abdomen and pelvis at [facility]. We do not have the images available but we do have the report. This shows a small umbilical hernia containing fat. He also has a moderate-sized supraumbilical ventral hernia containing fat only  Pre-operative Diagnosis: ventral incisional hernia  Post-operative Diagnosis: ventral incisional hernia  Surgeon: Maia Petties   Assistants: none  Anesthesia: General endotracheal anesthesia  ASA Class: 2  Procedure Details  The patient was seen in the Holding Room. The risks, benefits, complications, treatment options, and expected outcomes were discussed with the patient. The possibilities of reaction to medication, pulmonary aspiration, perforation of viscus, bleeding, recurrent infection, the need for additional procedures, failure to diagnose a condition, and creating a complication requiring transfusion or operation were discussed with the patient. The patient concurred with the proposed plan, giving informed consent.  The site of surgery properly noted/marked. The patient was taken to the operating room, identified as KRISTOFFER SKOLD and the procedure verified as laparoscopic ventral hernia repair with mesh. A Time Out was held and the above information confirmed.    The patient was placed supine.  After establishing general anesthesia, a Foley catheter was placed.  The abdomen was prepped with Chloraprep and draped in standard fashion.  A 5 mm Optiview was used the cannulate the peritoneal cavity in the left upper quadrant below the  costal margin.  Pneumoperitoneum was obtained by insufflating CO2, maintaining a maximum pressure of 15 mmHg.  The 5 mm 30-degree laparoscopic was inserted.  There were significant omental adhesions to the anterior abdominal wall in and around the hernia defect.  An 11-mm port was placed in the left anterior axillary line at the level of the umbilicus.  Another 5-mm port was placed in the left lower quadrant.  Harmonic scalpel and gentle traction were used to dissect the omentum out of the hernia defect.  Some of the omentum was detached and placed in a retrieval bag.  We cleared the entire abdominal wall and were able to visualize only one fascial defect.   This measured 2 cms.  We selected a 11.4 cm round piece of Bard Ventralight mesh.  We placed eight stay sutures of 0 Novofil around the edges of the mesh.  The mesh was then rolled up and inserted through the 11 mm port site.  The mesh was then unrolled.  The stay sutures were then pulled up through small stab incisions using the Endo-close device.  This deployed the mesh widely over the fascial defects.  The stay sutures were then tied down.  The Secure Strap device was then used to tack down the edges of the mesh at 1 cm intervals circumferentially.  We placed a few tacks inside the outer ring of tacks.  We inspected for hemostasis.  The retrieval bag containing omentum from the hernia sac was removed. The fascial defect at the 11 mm port site was closed with a 0 Vicryl using the Endoclose device.  Pneumoperitoneum was then released as we removed the remainder of the trocars.  The port sites were closed with 4-0 Monocryl.  All of the incisions and  stay suture sites were then sealed with Dermabond.  An abdominal binder was placed around the patient's abdomen.  The patient was extubated and brought to the recovery room in stable condition.  All sponge, instrument, and needle counts were correct prior to closure and at the conclusion of the case.   Findings:2  cm defect above umbilicus containing herniated omentum Estimated Blood Loss:  Minimal         Complications:  None; patient tolerated the procedure well.         Disposition: PACU - hemodynamically stable.         Condition: stable  Imogene Burn. Georgette Dover, MD, [facility]  General/ Trauma Surgery Beeper [phone]  11/28/2018 10:59 AM

## 2018-11-28 NOTE — Transfer of Care (Signed)
Immediate Anesthesia Transfer of Care Note  Patient: Vincent Summers  Procedure(s) Performed: LAPAROSCOPIC VENTRAL HERNIA REPAIR (N/A Abdomen) Insertion Of Mesh (N/A Abdomen)  Patient Location: PACU  Anesthesia Type:General  Level of Consciousness: drowsy and patient cooperative  Airway & Oxygen Therapy: Patient Spontanous Breathing  Post-op Assessment: Report given to RN, Post -op Vital signs reviewed and stable and Patient moving all extremities X 4  Post vital signs: Reviewed and stable  Last Vitals:  Vitals Value Taken Time  BP    Temp    Pulse    Resp    SpO2      Last Pain:  Vitals:   11/28/18 0755  TempSrc: Oral  PainSc:       Patients Stated Pain Goal: 4 ([id number])  Complications: No apparent anesthesia complications

## 2018-11-28 NOTE — Anesthesia Postprocedure Evaluation (Signed)
Anesthesia Post Note  Patient: Vincent Summers  Procedure(s) Performed: LAPAROSCOPIC VENTRAL HERNIA REPAIR (N/A Abdomen) Insertion Of Mesh (N/A Abdomen)     Patient location during evaluation: PACU Anesthesia Type: General Level of consciousness: awake and alert Pain management: pain level controlled Vital Signs Assessment: post-procedure vital signs reviewed and stable Respiratory status: spontaneous breathing, nonlabored ventilation and respiratory function stable Cardiovascular status: blood pressure returned to baseline and stable Postop Assessment: no apparent nausea or vomiting Anesthetic complications: no    Last Vitals:  Vitals:   11/28/18 1115 11/28/18 1130  BP: (!) 135/97 (!) 143/92  Pulse: 83 71  Resp: 18 16  Temp: 36.7 C 36.7 C  SpO2: 97% 94%    Last Pain:  Vitals:   11/28/18 1115  TempSrc:   PainSc: [place]

## 2018-11-28 NOTE — Anesthesia Procedure Notes (Signed)
Procedure Name: Intubation Date/Time: 11/28/2018 9:25 AM Performed by: Julieta Bellini, CRNA Pre-anesthesia Checklist: Patient identified, Emergency Drugs available, Suction available and Patient being monitored Patient Re-evaluated:Patient Re-evaluated prior to induction Oxygen Delivery Method: Circle system utilized Preoxygenation: Pre-oxygenation with 100% oxygen Induction Type: IV induction Ventilation: Oral airway inserted - appropriate to patient size, Two handed mask ventilation required and Mask ventilation without difficulty Laryngoscope Size: Mac and 4 Grade View: Grade I Tube type: Oral Tube size: 7.5 mm Number of attempts: 1 Airway Equipment and Method: Stylet Placement Confirmation: ETT inserted through vocal cords under direct vision,  positive ETCO2 and breath sounds checked- equal and bilateral Secured at: 23 cm Tube secured with: Tape Dental Injury: Teeth and Oropharynx as per pre-operative assessment

## 2018-11-28 NOTE — Discharge Instructions (Signed)
[facility] ______[facility] LAPAROSCOPIC SURGERY: POST OP INSTRUCTIONS Always review your discharge instruction sheet given to you by the facility where your surgery was performed. IF YOU HAVE DISABILITY OR FAMILY LEAVE FORMS, YOU MUST BRING THEM TO THE OFFICE FOR PROCESSING.   DO NOT GIVE THEM TO YOUR DOCTOR.  1. A prescription for pain medication may be given to you upon discharge.  Take your pain medication as prescribed, if needed.  If narcotic pain medicine is not needed, then you may take acetaminophen (Tylenol) or ibuprofen (Advil) as needed. 2. Take your usually prescribed medications unless otherwise directed. 3. If you need a refill on your pain medication, please contact your pharmacy.  They will contact our office to request authorization. Prescriptions will not be filled after 5pm or on week-ends. 4. You should follow a light diet the first few days after arrival home, such as soup and crackers, etc.  Be sure to include lots of fluids daily. 5. Most patients will experience some swelling and bruising in the area of the incisions.  Ice packs will help.  Swelling and bruising can take several days to resolve.  6. It is common to experience some constipation if taking pain medication after surgery.  Increasing fluid intake and taking a stool softener (such as Colace) will usually help or prevent this problem from occurring.  A mild laxative (Milk of Magnesia or Miralax) should be taken according to package instructions if there are no bowel movements after 48 hours. 7. Unless discharge instructions indicate otherwise, you may remove your bandages 24-48 hours after surgery, and you may shower at that time.  You may have steri-strips (small skin tapes) in place directly over the incision.  These strips should be left on the skin for 7-10 days.  If your surgeon used skin glue on the incision, you may shower in 24 hours.  The glue will flake off over the next 2-3 weeks.  Any sutures or  staples will be removed at the office during your follow-up visit. 8. ACTIVITIES:  You may resume regular (light) daily activities beginning the next day--such as daily self-care, walking, climbing stairs--gradually increasing activities as tolerated.  You may have sexual intercourse when it is comfortable.  Refrain from any heavy lifting or straining until approved by your doctor.  Wear your abdominal binder whenever you are out of bed and not in the shower. a. You may drive when you are no longer taking prescription pain medication, you can comfortably wear a seatbelt, and you can safely maneuver your car and apply brakes. b. RETURN TO WORK:  __________________________________________________________ 9. You should see your doctor in the office for a follow-up appointment approximately 2-3 weeks after your surgery.  Make sure that you call for this appointment within a day or two after you arrive home to insure a convenient appointment time. 10. OTHER INSTRUCTIONS: __________________________________________________________________________________________________________________________ __________________________________________________________________________________________________________________________ WHEN TO CALL YOUR DOCTOR: 1. Fever over 101.0 2. Inability to urinate 3. Continued bleeding from incision. 4. Increased pain, redness, or drainage from the incision. 5. Increasing abdominal pain  The clinic staff is available to answer your questions during regular business hours.  Please dont hesitate to call and ask to speak to one of the nurses for clinical concerns.  If you have a medical emergency, go to the nearest emergency room or call 911.  A surgeon from [facility] is always on call at the hospital. [address] [place], [place], [place]  [address] ? [address]  [address] [place], [place]   [address] [phone] ? [phone] ? FAX [phone] Web site:  [email or web address]

## 2018-11-28 NOTE — Interval H&P Note (Signed)
History and Physical Interval Note:  11/28/2018 8:28 AM  Vincent Summers  has presented today for surgery, with the diagnosis of Ventral incisional hernia.  The various methods of treatment have been discussed with the patient and family. After consideration of risks, benefits and other options for treatment, the patient has consented to  Procedure(s): [place] (N/A) as a surgical intervention.  The patient's history has been reviewed, patient examined, no change in status, stable for surgery.  I have reviewed the patient's chart and labs.  Questions were answered to the patient's satisfaction.     Maia Petties

## 2018-11-29 ENCOUNTER — Encounter (HOSPITAL_COMMUNITY): Payer: Self-pay | Admitting: Surgery

## 2018-12-29 ENCOUNTER — Other Ambulatory Visit: Payer: Self-pay

## 2018-12-29 DIAGNOSIS — Z20822 Contact with and (suspected) exposure to covid-19: Secondary | ICD-10-CM

## 2018-12-30 LAB — NOVEL CORONAVIRUS, NAA: SARS-CoV-2, NAA: NOT DETECTED

## 2019-02-09 ENCOUNTER — Other Ambulatory Visit: Payer: Self-pay

## 2019-02-09 DIAGNOSIS — Z20822 Contact with and (suspected) exposure to covid-19: Secondary | ICD-10-CM

## 2019-02-11 LAB — NOVEL CORONAVIRUS, NAA: SARS-CoV-2, NAA: NOT DETECTED

## 2019-04-29 ENCOUNTER — Encounter (HOSPITAL_COMMUNITY): Payer: Self-pay

## 2019-04-29 ENCOUNTER — Ambulatory Visit (HOSPITAL_COMMUNITY)
Admission: EM | Admit: 2019-04-29 | Discharge: 2019-04-29 | Disposition: A | Discharge status: Home / Self Care | Payer: BC Managed Care – PPO | Attending: Emergency Medicine | Admitting: Emergency Medicine

## 2019-04-29 ENCOUNTER — Other Ambulatory Visit: Payer: Self-pay

## 2019-04-29 DIAGNOSIS — H1032 Unspecified acute conjunctivitis, left eye: Secondary | ICD-10-CM | POA: Diagnosis not present

## 2019-04-29 MED ORDER — POLYMYXIN B-TRIMETHOPRIM 10000-0.1 UNIT/ML-% OP SOLN: 1.0000 [drp] | Freq: Four times a day (QID) | OPHTHALMIC | 0 refills | Status: DC

## 2019-04-29 MED ORDER — OLOPATADINE HCL 0.1 % OP SOLN: 1.0000 [drp] | Freq: Two times a day (BID) | OPHTHALMIC | 12 refills | Status: DC

## 2019-04-29 NOTE — Discharge Instructions (Signed)
-   Begin polytrim 1-2 drops every 6 hours for 1 week to cover infection - Have Good Hand Hygiene  - Use Cold Compresses  - Artificial Tears 5-6 times a day  - Use olopatadine for itching/burning relief twice daily  Follow-up if symptoms not improving or worsening, developing vision changes, increased pain or light sensitivity

## 2019-04-29 NOTE — ED Triage Notes (Signed)
Pt present left eye problem, symptoms started on Thursday. His left eye is itching burning, and watery feels like something is inside his eye.

## 2019-04-29 NOTE — ED Provider Notes (Signed)
[place]    CSN: [id number] Arrival date & time: 04/29/19  1019      History   Chief Complaint Chief Complaint  Patient presents with  . Eye Problem    HPI Vincent Summers is a 59 y.o. male history of hypertension, GERD, presenting today for evaluation of left eye irritation.  Patient states that beginning Thursday he began to have some itching, burning and foreign body sensation.  Denies any injury or trauma or known foreign body.  Occasionally watery, occasionally feeling dry.  Does report some mild blurry vision.  Denies contact use.  Occasional throbbing sensation in eye, but denies significant pain.  States starting to feel mildly similar symptoms in right eye.  Denies associated URI symptoms.  Reports mild photophobia towards end of day.  Symptoms actually slightly improved today compared to prior days. HPI  Past Medical History:  Diagnosis Date  . Anxiety   . Arthritis   . GERD (gastroesophageal reflux disease)   . History of hiatal hernia   . Hypercholesteremia   . Hypertension   . NAFL (nonalcoholic fatty liver)   . Palpitations   . Rosacea   . Spinal stenosis at L4-L5 level     Patient Active Problem List   Diagnosis Date Noted  . NAFLD (nonalcoholic fatty liver disease) 04/15/2014  . Dyslipidemia 04/15/2014  . Chest pain 02/28/2014  . GERD (gastroesophageal reflux disease) 11/21/2013  . Arthritis   . Hypertension   . Anxiety     Past Surgical History:  Procedure Laterality Date  . APPENDECTOMY    . CARDIAC CATHETERIZATION  2007   Dr. Haroldine Laws  . INSERTION OF MESH N/A 11/28/2018   Procedure: Insertion Of Mesh;  Surgeon: Donnie Mesa, MD;  Location: [place];  Service: General;  Laterality: N/A;  . KNEE SURGERY    . SHOULDER SURGERY    . TONSILLECTOMY    . VENTRAL HERNIA REPAIR N/A 11/28/2018   Procedure: LAPAROSCOPIC VENTRAL HERNIA REPAIR;  Surgeon: Donnie Mesa, MD;  Location: [place];  Service: General;  Laterality: N/A;       Home  Medications    Prior to Admission medications   Medication Sig Start Date End Date Taking? Authorizing Provider  acetaminophen (TYLENOL) 500 MG tablet Take 1,000 mg by mouth every 6 (six) hours as needed for moderate pain or headache.    [provider]  ALPRAZolam Duanne Moron) 0.5 MG tablet Take 1 tablet (0.5 mg total) by mouth 3 (three) times daily as needed. Patient taking differently: Take 0.5 mg by mouth 3 (three) times daily as needed for anxiety.  12/07/16   Susy Frizzle, MD  benazepril (LOTENSIN) 40 MG tablet Take 40 mg by mouth daily at 12 noon.    [provider]  esomeprazole (NEXIUM) 40 MG capsule TAKE ONE CAPSULE BY MOUTH EVERY DAY Patient taking differently: Take 40 mg by mouth daily at 12 noon.  05/03/16   Susy Frizzle, MD  gabapentin (NEURONTIN) 300 MG capsule Take 300 mg by mouth 2 (two) times daily.    [provider]  hydrochlorothiazide (HYDRODIURIL) 25 MG tablet Take 1 tablet (25 mg total) by mouth daily. Patient taking differently: Take 25 mg by mouth daily at 12 noon.  04/15/17   Susy Frizzle, MD  minocycline (MINOCIN,DYNACIN) 100 MG capsule Take 1 capsule (100 mg total) by mouth 2 (two) times daily. 04/04/15   Susy Frizzle, MD  olopatadine (PATANOL) 0.1 % ophthalmic solution Place 1 drop into  both eyes 2 (two) times daily. 04/29/19   Tejal Monroy C, PA-C  oxyCODONE (OXY IR/ROXICODONE) 5 MG immediate release tablet Take 1 tablet (5 mg total) by mouth every 6 (six) hours as needed for severe pain. 11/28/18   Donnie Mesa, MD  sildenafil (REVATIO) 20 MG tablet Take 20-60 mg by mouth daily as needed (ED).    [provider]  trimethoprim-polymyxin b (POLYTRIM) ophthalmic solution Place 1 drop into both eyes every 6 (six) hours. 04/29/19   Sparkles Mcneely, Elesa Hacker, PA-C    Family History Family History  Problem Relation Age of Onset  . Emphysema Mother   . Skin cancer Mother   . Leukemia Father   . Heart disease Father 66        CAD---CABG @ age 49  . Arrhythmia Father   . Arrhythmia Sister   . Stroke Maternal Grandmother 90  . Heart disease Maternal Grandfather   . Stroke Maternal Grandfather 77  . Hypertension Paternal Grandmother   . Stroke Paternal Grandmother 85  . Heart disease Paternal Grandfather 38    Social History Social History   Tobacco Use  . Smoking status: Never Smoker  . Smokeless tobacco: Never Used  Substance Use Topics  . Alcohol use: Yes    Alcohol/week: 14.0 standard drinks    Types: 14 Cans of beer per week    Comment: Daily  . Drug use: No     Allergies   Fenofibrate   Review of Systems Review of Systems  Constitutional: Negative for activity change, appetite change, chills, fatigue and fever.  HENT: Negative for congestion, ear pain, rhinorrhea, sinus pressure, sore throat and trouble swallowing.   Eyes: Positive for photophobia, discharge, itching and visual disturbance. Negative for redness.  Respiratory: Negative for cough, chest tightness and shortness of breath.   Cardiovascular: Negative for chest pain.  Gastrointestinal: Negative for abdominal pain, diarrhea, nausea and vomiting.  Musculoskeletal: Negative for myalgias.  Skin: Negative for rash.  Neurological: Negative for dizziness, light-headedness and headaches.     Physical Exam Triage Vital Signs ED Triage Vitals  Enc Vitals Group     BP 04/29/19 1039 (!) 149/97     Pulse Rate 04/29/19 1039 80     Resp 04/29/19 1039 18     Temp 04/29/19 1039 (!) 97.4 F (36.3 C)     Temp Source 04/29/19 1039 Oral     SpO2 04/29/19 1039 99 %     Weight --      Height --      Head Circumference --      Peak Flow --      Pain Score 04/29/19 1041 3     Pain Loc --      Pain Edu? --      Excl. in [place]? --    No data found.  Updated Vital Signs BP (!) 149/97 (BP Location: Right Arm)   Pulse 80   Temp (!) 97.4 F (36.3 C) (Oral)   Resp 18   SpO2 99%   Visual Acuity Right Eye Distance: 20/20 Left Eye  Distance: 20/25 Bilateral Distance:    Right Eye Near:   Left Eye Near:    Bilateral Near:     Physical Exam Vitals and nursing note reviewed.  Constitutional:      Appearance: He is well-developed.     Comments: No acute distress  HENT:     Head: Normocephalic and atraumatic.     Ears:     Comments: Bilateral  cerumen impactions, TMs minimally visualized    Nose: Nose normal.     Mouth/Throat:     Comments: Oral mucosa pink and moist, no tonsillar enlargement or exudate. Posterior pharynx patent and nonerythematous, no uvula deviation or swelling. Normal phonation. Eyes:     Extraocular Movements: Extraocular movements intact.     Conjunctiva/sclera: Conjunctivae normal.     Pupils: Pupils are equal, round, and reactive to light.     Comments: Left eye with mild conjunctival erythema compared to left, no obvious discharge or drainage noted, limbus clear, no significant photophobia with exam, anterior chamber clear.  No fluorescein uptake.  Pressure measuring 18.  Cardiovascular:     Rate and Rhythm: Normal rate.  Pulmonary:     Effort: Pulmonary effort is normal. No respiratory distress.  Abdominal:     General: There is no distension.  Musculoskeletal:        General: Normal range of motion.     Cervical back: Neck supple.  Skin:    General: Skin is warm and dry.  Neurological:     Mental Status: He is alert and oriented to person, place, and time.      UC Treatments / Results  Labs (all labs ordered are listed, but only abnormal results are displayed) Labs Reviewed - No data to display  EKG   Radiology No results found.  Procedures Procedures (including critical care time)  Medications Ordered in UC Medications - No data to display  Initial Impression / Assessment and Plan / UC Course  I have reviewed the triage vital signs and the nursing notes.  Pertinent labs & imaging results that were available during my care of the patient were reviewed by me and  considered in my medical decision making (see chart for details).     Exam suggestive of likely conjunctivitis, viral versus allergic versus bacterial.  We will go ahead and cover for bacterial given symptoms unilateral, but also providing olopatadine for symptomatic management of any itching/burning sensation.  No foreign body visualized, visual acuity relatively intact.  No red flags for ocular emergency.  Continue to monitor,Discussed strict return precautions. Patient verbalized understanding and is agreeable with plan.  Final Clinical Impressions(s) / UC Diagnoses   Final diagnoses:  Acute conjunctivitis of left eye, unspecified acute conjunctivitis type     Discharge Instructions     - Begin polytrim 1-2 drops every 6 hours for 1 week to cover infection - Have Good Hand Hygiene  - Use Cold Compresses  - Artificial Tears 5-6 times a day  - Use olopatadine for itching/burning relief twice daily  Follow-up if symptoms not improving or worsening, developing vision changes, increased pain or light sensitivity    ED Prescriptions    Medication Sig Dispense Auth. Provider   trimethoprim-polymyxin b (POLYTRIM) ophthalmic solution Place 1 drop into both eyes every 6 (six) hours. 10 mL Winona Sison C, PA-C   olopatadine (PATANOL) 0.1 % ophthalmic solution Place 1 drop into both eyes 2 (two) times daily. 5 mL Katelee Schupp, [place] C, PA-C     PDMP not reviewed this encounter.   Janith Lima, [place] 04/29/19 1112

## 2019-06-22 ENCOUNTER — Ambulatory Visit (INDEPENDENT_AMBULATORY_CARE_PROVIDER_SITE_OTHER): Payer: BC Managed Care – PPO | Admitting: Internal Medicine

## 2019-06-22 ENCOUNTER — Other Ambulatory Visit: Payer: Self-pay

## 2019-06-22 ENCOUNTER — Encounter: Payer: Self-pay | Admitting: Internal Medicine

## 2019-06-22 VITALS — BP 124/73 | HR 66 | Temp 97.1°F | Ht 73.0 in | Wt 238.0 lb

## 2019-06-22 DIAGNOSIS — E782 Mixed hyperlipidemia: Secondary | ICD-10-CM | POA: Diagnosis not present

## 2019-06-22 DIAGNOSIS — R079 Chest pain, unspecified: Secondary | ICD-10-CM

## 2019-06-22 DIAGNOSIS — Z8249 Family history of ischemic heart disease and other diseases of the circulatory system: Secondary | ICD-10-CM

## 2019-06-22 DIAGNOSIS — I10 Essential (primary) hypertension: Secondary | ICD-10-CM | POA: Diagnosis not present

## 2019-06-22 MED ORDER — METOPROLOL TARTRATE 50 MG PO TABS: ORAL_TABLET | ORAL | 0 refills | Status: DC

## 2019-06-22 NOTE — Patient Instructions (Signed)
Medication Instructions:  Take Metoprolol 50 mg 2 hours before CT when scheduled.   *If you need a refill on your cardiac medications before your next appointment, please call your pharmacy*   Lab Work: BMET one week before CT when scheduled.  If you have labs (blood work) drawn today and your tests are completely normal, you will receive your results only by: Marland Kitchen MyChart Message (if you have MyChart) OR . A paper copy in the mail If you have any lab test that is abnormal or we need to change your treatment, we will call you to review the results.   Testing/Procedures: Your physician has requested that you have cardiac CT. Cardiac computed tomography (CT) is a painless test that uses an x-ray machine to take clear, detailed pictures of your heart. For further information please visit [email or web address]. Please follow instruction sheet as given.   Follow-Up: At [facility], you and your health needs are our priority.  As part of our continuing mission to provide you with exceptional heart care, we have created designated Provider Care Teams.  These Care Teams include your primary Cardiologist (physician) and Advanced Practice Providers (APPs -  Physician Assistants and Nurse Practitioners) who all work together to provide you with the care you need, when you need it.  We recommend signing up for the patient portal called "MyChart".  Sign up information is provided on this After Visit Summary.  MyChart is used to connect with patients for Virtual Visits (Telemedicine).  Patients are able to view lab/test results, encounter notes, upcoming appointments, etc.  Non-urgent messages can be sent to your provider as well.   To learn more about what you can do with MyChart, go to [email or web address].    Your next appointment:   After CT is scheduled.  The format for your next appointment:   Either In Person or Virtual  Provider:   Raliegh Ip Mali Hilty, MD   Other Instructions   Your  cardiac CT will be scheduled at one of the below locations:   [facility] [address] [place], [place] [address] [phone]  If scheduled at [facility], please arrive at the [facility] main entrance of [facility] 30 minutes prior to test start time. Proceed to the [facility] Radiology Department (first floor) to check-in and test prep.  Please follow these instructions carefully (unless otherwise directed):  Hold all erectile dysfunction medications at least 3 days (72 hrs) prior to test.  On the Night Before the Test: . Be sure to Drink plenty of water. . Do not consume any caffeinated/decaffeinated beverages or chocolate 12 hours prior to your test. . Do not take any antihistamines 12 hours prior to your test. . If you take Metformin do not take 24 hours prior to test.  On the Day of the Test: . Drink plenty of water. Do not drink any water within one hour of the test. . Do not eat any food 4 hours prior to the test. . You may take your regular medications prior to the test.  . Take metoprolol (Lopressor) two hours prior to test. . HOLD Furosemide/Hydrochlorothiazide morning of the test. . FEMALES- please wear underwire-free bra if available       After the Test: . Drink plenty of water. . After receiving IV contrast, you may experience a mild flushed feeling. This is normal. . On occasion, you may experience a mild rash up to 24 hours after the test. This is not dangerous.  If this occurs, you can take Benadryl 25 mg and increase your fluid intake. . If you experience trouble breathing, this can be serious. If it is severe call 911 IMMEDIATELY. If it is mild, please call our office. . If you take any of these medications: Glipizide/Metformin, Avandament, Glucavance, please do not take 48 hours after completing test unless otherwise instructed.   Once we have confirmed authorization from your insurance company, we will call you to set up a  date and time for your test.   For non-scheduling related questions, please contact the cardiac imaging nurse navigator should you have any questions/concerns: Marchia Bond, RN Navigator Cardiac Imaging [facility] [phone] office  For scheduling needs, including cancellations and rescheduling, please call [phone].

## 2019-06-22 NOTE — Progress Notes (Signed)
OFFICE NOTE  Chief Complaint:  Chest pain  Primary Care Physician: Chesley Noon, MD  HPI:  Vincent Summers is a pleasant 58 year old male with a strong family history of coronary disease. He reports that his father had heart disease and prior bypass as well as a defibrillator implanted and died at age 22. There is also extensive heart disease and no sisters who have arrhythmias and a pacemaker as well as in his grandparents. He does have a history of anxiety is well. He's recently had some chest pain symptoms which are reminiscent of pain these had many years in the past. In 2007 he complained of some chest pain and underwent cardiac catheterization by Dr. Haroldine Laws which demonstrated essentially normal coronary arteries with normal LV function. He's developed some mild hypertension and is on medication for that but was told that he has good cholesterol control with a total cholesterol less than 200. Recently he's been having some discomfort in his chest and tingling in his left arm. The symptoms are little bit worse with exertion and relieved somewhat by rest but can occur at other times without doing activities. 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  Vincent Summers returns today for follow-up. He underwent an exercise stress test which was negative for ischemia but did show hypertensive response. He had a CT calcium score which resulted is 0. This is highly suggestive of low risk of coronary events. Incidentally, it was noted by the radiologist that there is a nonalcoholic fatty liver disease. It is noted that his cholesterol is elevated and he reports drinking a couple of beers daily after work.  06/22/2019  Vincent Summers is seen today for acute chest pain.  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  This is described as substernal nonradiating but more intense than any pain has had before.  I last saw him for this and  evaluated him in January 2016 therefore he is considered a new patient.  As mentioned above he underwent cardiac catheterization by Dr. Haroldine Laws in 2007 which showed essentially normal coronaries.  PMHx:  Past Medical History:  Diagnosis Date  . Anxiety   . Arthritis   . GERD (gastroesophageal reflux disease)   . History of hiatal hernia   . Hypercholesteremia   . Hypertension   . NAFL (nonalcoholic fatty liver)   . Palpitations   . Rosacea   . Spinal stenosis at L4-L5 level     Past Surgical History:  Procedure Laterality Date  . APPENDECTOMY    . CARDIAC CATHETERIZATION  2007   Dr. Haroldine Laws  . INSERTION OF MESH N/A 11/28/2018   Procedure: Insertion Of Mesh;  Surgeon: Donnie Mesa, MD;  Location: [place];  Service: General;  Laterality: N/A;  . KNEE SURGERY    . SHOULDER SURGERY    . TONSILLECTOMY    . VENTRAL HERNIA REPAIR N/A 11/28/2018   Procedure: LAPAROSCOPIC VENTRAL HERNIA REPAIR;  Surgeon: Donnie Mesa, MD;  Location: [place];  Service: General;  Laterality: N/A;    FAMHx:  Family History  Problem Relation Age of Onset  . Emphysema Mother   . Skin cancer Mother   . Leukemia Father   . Heart disease Father 88       CAD---CABG @ age 82  . Arrhythmia Father   . Arrhythmia Sister   . Stroke Maternal Grandmother 90  . Heart disease Maternal Grandfather   .  Stroke Maternal Grandfather 66  . Hypertension Paternal Grandmother   . Stroke Paternal Grandmother 85  . Heart disease Paternal Grandfather 23    SOCHx:   reports that he has never smoked. He has never used smokeless tobacco. He reports current alcohol use of about 14.0 standard drinks of alcohol per week. He reports that he does not use drugs.  ALLERGIES:  Allergies  Allergen Reactions  . Fenofibrate Other (See Comments)    Back pain    ROS: A comprehensive review of systems was negative.  HOME MEDS: Current Outpatient Medications  Medication Sig Dispense Refill  . acetaminophen (TYLENOL) 500 MG  tablet Take 1,000 mg by mouth every 6 (six) hours as needed for moderate pain or headache.    . ALPRAZolam (XANAX) 0.5 MG tablet Take 0.5 mg by mouth at bedtime as needed for anxiety. TAKE 3 TIMES DAILY PRN    . benazepril (LOTENSIN) 40 MG tablet Take 40 mg by mouth daily at 12 noon.    Marland Kitchen esomeprazole (NEXIUM) 40 MG capsule Take 40 mg by mouth daily at 12 noon.    . gabapentin (NEURONTIN) 300 MG capsule Take 300 mg by mouth 2 (two) times daily.    . hydrochlorothiazide (HYDRODIURIL) 25 MG tablet Take 1 tablet (25 mg total) by mouth daily. (Patient taking differently: Take 25 mg by mouth daily at 12 noon. ) 30 tablet 0  . minocycline (MINOCIN,DYNACIN) 100 MG capsule Take 1 capsule (100 mg total) by mouth 2 (two) times daily. 180 capsule 1  . sildenafil (REVATIO) 20 MG tablet Take 20-60 mg by mouth daily as needed (ED).    . traMADol (ULTRAM) 50 MG tablet Take 50 mg by mouth every 6 (six) hours as needed.    . trimethoprim-polymyxin b (POLYTRIM) ophthalmic solution Place 1 drop into both eyes every 6 (six) hours. 10 mL 0  . metoprolol tartrate (LOPRESSOR) 50 MG tablet Take 1 tablet by mouth once for procedure. 1 tablet 0   No current facility-administered medications for this visit.    LABS/IMAGING: No results found for this or any previous visit (from the past 48 hour(s)). No results found.  VITALS: BP 124/73   Pulse 66   Temp (!) 97.1 F (36.2 C)   Ht 6\' 1"  (1.854 m)   Wt 238 lb (108 kg)   SpO2 99%   BMI 31.40 kg/m   EXAM: General appearance: alert and no distress Neck: no carotid bruit, no JVD and thyroid not enlarged, symmetric, no tenderness/mass/nodules Lungs: clear to auscultation bilaterally Heart: regular rate and rhythm Abdomen: soft, non-tender; bowel sounds normal; no masses,  no organomegaly Extremities: extremities normal, atraumatic, no cyanosis or edema Pulses: 2+ and symmetric Skin: Skin color, texture, turgor normal. No rashes or lesions Neurologic: Grossly  normal Psych: Pleasant  EKG: Normal sinus rhythm, nonspecific T wave changes at 66-personally reviewed  ASSESSMENT: 1. Chest pain, possibly unstable angina 2. History of more atypical chest pain - negative exercise stress test (2016)- Zero calcium score 3. 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 Strong family history of coronary disease 5. Hypertension 6. NAFLD  PLAN: 1.   Vincent Summers has had work-up for coronary disease in the past with episodes of chest pain however this was apparently the worst one that he mention.  He had a catheterization in 2007 which showed minimal nonobstructive coronary disease.  He had a 0 calcium score and a negative exercise stress test in 2016.  Given his strong family history and the  fact that he is now 20 with cardiovascular risk factors, a coronary evaluation is warranted.  I would recommend a CT coronary angiogram as the symptoms are not highly suggestive of angina and in my mind do not warrant direct cardiac catheterization.  Heart rate is in the 60s which should be able to be adequately beta blocked.  He is agreeable to this approach.  We will plan to follow-up with him afterwards.  Pixie Casino, MD, [facility], [place] Director of the Advanced Lipid Disorders &  Cardiovascular Risk Reduction Clinic Diplomate of the American Board of Clinical Lipidology Attending Cardiologist  Direct Dial: [phone]  Fax: [phone]  Website:  [email or web address] 06/22/2019, 9:54 PM

## 2019-07-09 LAB — BASIC METABOLIC PANEL
BUN/Creatinine Ratio: 16 (ref 9–20)
BUN: 11 mg/dL (ref 6–24)
CO2: 26 mmol/L (ref 20–29)
Calcium: 9.3 mg/dL (ref 8.7–10.2)
Chloride: 100 mmol/L (ref 96–106)
Creatinine, Ser: 0.68 mg/dL — ABNORMAL LOW (ref 0.76–1.27)
GFR calc Af Amer: 123 mL/min/{1.73_m2} (ref >59)
GFR calc non Af Amer: 106 mL/min/{1.73_m2} (ref >59)
Glucose: 104 mg/dL — ABNORMAL HIGH (ref 65–99)
Potassium: 4.4 mmol/L (ref 3.5–5.2)
Sodium: 139 mmol/L (ref 134–144)

## 2019-07-13 ENCOUNTER — Telehealth (HOSPITAL_COMMUNITY): Payer: Self-pay | Admitting: Emergency Medicine

## 2019-07-13 NOTE — Telephone Encounter (Signed)
Reaching out to patient to offer assistance regarding upcoming cardiac imaging study; pt verbalizes understanding of appt date/time, parking situation and where to check in, pre-test NPO status and medications ordered, and verified current allergies; name and call back number provided for further questions should they arise Braun Rocca RN Navigator Cardiac Imaging  Heart and Vascular [phone] office [phone] cell 

## 2019-07-16 ENCOUNTER — Ambulatory Visit (HOSPITAL_COMMUNITY)
Admission: RE | Admit: 2019-07-16 | Discharge: 2019-07-16 | Disposition: A | Discharge status: Home / Self Care | Payer: BC Managed Care – PPO | Source: Ambulatory Visit | Attending: Internal Medicine | Admitting: Internal Medicine

## 2019-07-16 ENCOUNTER — Other Ambulatory Visit: Payer: Self-pay

## 2019-07-16 ENCOUNTER — Encounter (HOSPITAL_COMMUNITY): Payer: Self-pay

## 2019-07-16 DIAGNOSIS — R079 Chest pain, unspecified: Secondary | ICD-10-CM | POA: Insufficient documentation

## 2019-07-16 MED ORDER — NITROGLYCERIN 0.4 MG SL SUBL
SUBLINGUAL_TABLET | SUBLINGUAL | Status: AC
  Filled 2019-07-16: qty 2

## 2019-07-16 MED ORDER — IOHEXOL 350 MG/ML SOLN
80.0000 mL | Freq: Once | INTRAVENOUS | Status: AC | PRN
  Administered 2019-07-16: 80 mL via INTRAVENOUS

## 2019-07-16 MED ORDER — NITROGLYCERIN 0.4 MG SL SUBL
0.8000 mg | SUBLINGUAL_TABLET | Freq: Once | SUBLINGUAL | Status: AC
  Administered 2019-07-16: 0.8 mg via SUBLINGUAL

## 2019-07-24 ENCOUNTER — Ambulatory Visit: Payer: BC Managed Care – PPO | Admitting: Physician Assistant

## 2019-07-30 ENCOUNTER — Telehealth (INDEPENDENT_AMBULATORY_CARE_PROVIDER_SITE_OTHER): Payer: BC Managed Care – PPO | Admitting: Physician Assistant

## 2019-07-30 DIAGNOSIS — E785 Hyperlipidemia, unspecified: Secondary | ICD-10-CM | POA: Diagnosis not present

## 2019-07-30 DIAGNOSIS — R002 Palpitations: Secondary | ICD-10-CM

## 2019-07-30 DIAGNOSIS — I10 Essential (primary) hypertension: Secondary | ICD-10-CM | POA: Diagnosis not present

## 2019-07-30 DIAGNOSIS — I2510 Atherosclerotic heart disease of native coronary artery without angina pectoris: Secondary | ICD-10-CM | POA: Insufficient documentation

## 2019-07-30 MED ORDER — ATORVASTATIN CALCIUM 20 MG PO TABS: 20.0000 mg | ORAL_TABLET | Freq: Every day | ORAL | 3 refills | Status: DC

## 2019-07-30 NOTE — Progress Notes (Signed)
Virtual Visit via Video Note   This visit type was conducted due to national recommendations for restrictions regarding the COVID-19 Pandemic (e.g. social distancing) in an effort to limit this patient's exposure and mitigate transmission in our community.  Due to his co-morbid illnesses, this patient is at least at moderate risk for complications without adequate follow up.  This format is felt to be most appropriate for this patient at this time.  All issues noted in this document were discussed and addressed.  A limited physical exam was performed with this format.  Please refer to the patient's chart for his consent to telehealth for [facility].   The patient was identified using 2 identifiers.  Date:  08/01/2019   ID:  Vincent Summers, DOB 1960/06/03, MRN [id number]  Patient Location: Home Provider Location: Home  PCP:  Chesley Noon, MD  Cardiologist:  Pixie Casino, MD Electrophysiologist:  None   Evaluation Performed:  Follow-Up Visit  Chief Complaint:  followup  History of Present Illness:    Vincent Summers is a 59 y.o. male with past medical history of hypertension, hyperlipidemia, palpitation, anxiety and a strong family history of CAD.  His father had heart disease and a bypass surgery as well as ICD implanted.  In 2007, he had some chest pain and underwent cardiac catheterization which demonstrated essentially normal coronary arteries.  He was previously evaluated in January 2016 for chest pain.  Coronary calcium scoring obtained on 04/02/2014 was 0.  ETT obtained on the same day was normal.  More recently, patient was seen again by Dr. Debara Pickett on 06/22/2019 for recurrent chest discomfort.  Due to recurrent chest discomfort, he underwent a coronary CT on 07/16/2019 that showed a coronary calcium score of 49.4 which placed the patient at the 65th percentile for age and sex, less than 25% disease in the left main, LAD, and D1 and D2, he had normal left circumflex and RCA.  His  chest pain was unlikely to be cardiac in nature.  Patient presents today for MyChart video visit.  I had to convert from video visit to telephone visit after the first 3 minutes, the rest of 20 minutes interview was conducted using telephone instead.  We discussed the recent coronary CT result, he continued to have occasional dull ache in the chest however the sharp pain that caused him to be very concerned previously is gone.  I reassured the patient.  Given the progression of coronary artery disease from 2016, I encouraged him to start on Lipitor 20 mg daily.  He will need a fasting lipid panel and LFT in 2 months.  His most recent lipid panel from [facility] showed uncontrolled triglyceride and LDL in the 90s.  I recommend an LDL goal of less than 70 instead.  Furthermore, he says he has been having intermittent palpitation for the past year.  The palpitation occurs about once a week and last summer between 5 to 10 seconds, despite the short duration, he says he usually have dizziness during the episode.  I recommended 2-week Zio monitor to further investigate.  Right now he is not on any scheduled beta-blocker therapy.  I will wait for the monitor results before adjusting his medication.  Otherwise he can follow-up with Dr. Debara Pickett in 3 month.   The patient does not have symptoms concerning for COVID-19 infection (fever, chills, cough, or new shortness of breath).    Past Medical History:  Diagnosis Date  . Anxiety   . Arthritis   .  GERD (gastroesophageal reflux disease)   . History of hiatal hernia   . Hypercholesteremia   . Hypertension   . NAFL (nonalcoholic fatty liver)   . Palpitations   . Rosacea   . Spinal stenosis at L4-L5 level    Past Surgical History:  Procedure Laterality Date  . APPENDECTOMY    . CARDIAC CATHETERIZATION  2007   Dr. Haroldine Laws  . INSERTION OF MESH N/A 11/28/2018   Procedure: Insertion Of Mesh;  Surgeon: Donnie Mesa, MD;  Location: [place];  Service: General;   Laterality: N/A;  . KNEE SURGERY    . SHOULDER SURGERY    . TONSILLECTOMY    . VENTRAL HERNIA REPAIR N/A 11/28/2018   Procedure: LAPAROSCOPIC VENTRAL HERNIA REPAIR;  Surgeon: Donnie Mesa, MD;  Location: [place];  Service: General;  Laterality: N/A;     No outpatient medications have been marked as taking for the 07/30/19 encounter (Video Visit) with Almyra Deforest, [place].     Allergies:   Fenofibrate   Social History   Tobacco Use  . Smoking status: Never Smoker  . Smokeless tobacco: Never Used  Substance Use Topics  . Alcohol use: Yes    Alcohol/week: 14.0 standard drinks    Types: 14 Cans of beer per week    Comment: Daily  . Drug use: No     Family Hx: The patient's family history includes Arrhythmia in his father and sister; Emphysema in his mother; Heart disease in his maternal grandfather; Heart disease (age of onset: 22) in his father; Heart disease (age of onset: 29) in his paternal grandfather; Hypertension in his paternal grandmother; Leukemia in his father; Skin cancer in his mother; Stroke (age of onset: 86) in his maternal grandfather; Stroke (age of onset: 88) in his paternal grandmother; Stroke (age of onset: 104) in his maternal grandmother.  ROS:   Please see the history of present illness.     All other systems reviewed and are negative.   Prior CV studies:   The following studies were reviewed today:  Coronary CT 07/16/2019 Coronary Arteries: Right dominant with no anomalies  LM: 1-24% calcific ostial stenosis  LAD: 1-24% proximal and mid calcific stenosis  D1: 1-24% calcific stenosis  D2: 1-24% calcific stenosis  Circumflex: Normal  OM1: Normal  OM2: Normal  RCA: Normal  PDA: Normal  PLA: Normal  IMPRESSION: 1. Calcium score 49.4 which is 53 th percentile for age and sex  2.  Normal aortic root diameter 3.5 cm  3.  CAD RADS 1 non obstructive CAD see description above  Labs/Other Tests and Data Reviewed:    EKG:  An ECG dated  06/22/2019 was personally reviewed today and demonstrated:  Normal sinus rhythm without significant ST-T wave changes  Recent Labs: 11/22/2018: ALT 41; Hemoglobin 14.6; Platelets 210 07/09/2019: BUN 11; Creatinine, Ser 0.68; Potassium 4.4; Sodium 139   Recent Lipid Panel Lab Results  Component Value Date/Time   CHOL 181 02/12/2015 01:00 PM   TRIG 416 (H) 02/12/2015 01:00 PM   HDL 33 (L) 02/12/2015 01:00 PM   CHOLHDL 5.5 (H) 02/12/2015 01:00 PM   LDLCALC NOT CALC 02/12/2015 01:00 PM    Wt Readings from Last 3 Encounters:  06/22/19 238 lb (108 kg)  11/28/18 242 lb (109.8 kg)  11/22/18 245 lb 11.2 oz (111.4 kg)     Objective:    Vital Signs:  There were no vitals taken for this visit.   VITAL SIGNS:  reviewed  ASSESSMENT & PLAN:  1. CAD: Minimal disease noted on recent coronary CT, however this does represent progression of disease when compared to the previous coronary calcium scoring that showed coronary calcium score of 0.  I recommended to start on low-dose Lipitor for risk factor control  2. Hypertension: Continue on current therapy  3. Hyperlipidemia: Started on low-dose Lipitor.  Fasting lipid panel and LFT in 2 to 3 months  4. Palpitation: Proceed with 2-week Zio monitor.  COVID-19 Education: The signs and symptoms of COVID-19 were discussed with the patient and how to seek care for testing (follow up with PCP or arrange E-visit).  The importance of social distancing was discussed today.  Time:   Today, I have spent 21 minutes with the patient with telehealth technology discussing the above problems.     Medication Adjustments/Labs and Tests Ordered: Current medicines are reviewed at length with the patient today.  Concerns regarding medicines are outlined above.   Tests Ordered: Orders Placed This Encounter  Procedures  . Lipid panel  . Hepatic function panel  . LONG TERM MONITOR (3-14 DAYS)    Medication Changes: Meds ordered this encounter  Medications    . atorvastatin (LIPITOR) 20 MG tablet    Sig: Take 1 tablet (20 mg total) by mouth daily.    Dispense:  90 tablet    Refill:  3    Follow Up:  Either In Person or Virtual in 6 month(s)  Signed, Almyra Deforest, [place]  08/01/2019 11:34 PM    [facility]

## 2019-07-30 NOTE — Patient Instructions (Addendum)
Your physician has recommended you make the following change in your medication:  START ATORVASTATIN 20 G EVERY PM  Your physician recommends that you return for lab work in:  2-3  [place] physician has recommended that you wear an event monitor. Event monitors are medical devices that record the heart's electrical activity. Doctors most often [place] these monitors to diagnose arrhythmias. Arrhythmias are problems with the speed or rhythm of the heartbeat. The monitor is a small, portable device. You can wear one while you do your normal daily activities. This is usually used to diagnose what is causing palpitations/syncope (passing out).  [place]  Your physician recommends that you schedule a follow-up appointment in:  [place]

## 2019-08-13 ENCOUNTER — Ambulatory Visit (INDEPENDENT_AMBULATORY_CARE_PROVIDER_SITE_OTHER): Payer: BC Managed Care – PPO

## 2019-08-13 DIAGNOSIS — R002 Palpitations: Secondary | ICD-10-CM

## 2019-09-25 ENCOUNTER — Telehealth: Payer: Self-pay

## 2019-09-25 NOTE — Telephone Encounter (Addendum)
Left a detailed voice message for the patient of the comments from the ordering provider and asked that if he has any questions to give our office a call    ----- Message from Almyra Deforest, [place] sent at 09/25/2019  9:09 AM EDT ----- Discuss on the next follow up. Largely normal rhythm with a few episodes of fast beats

## 2019-10-10 ENCOUNTER — Encounter: Payer: Self-pay | Admitting: Internal Medicine

## 2019-10-10 ENCOUNTER — Ambulatory Visit (INDEPENDENT_AMBULATORY_CARE_PROVIDER_SITE_OTHER): Payer: BC Managed Care – PPO | Admitting: Internal Medicine

## 2019-10-10 ENCOUNTER — Other Ambulatory Visit: Payer: Self-pay

## 2019-10-10 VITALS — BP 142/88 | HR 71 | Ht 73.0 in | Wt 248.0 lb

## 2019-10-10 DIAGNOSIS — Z8249 Family history of ischemic heart disease and other diseases of the circulatory system: Secondary | ICD-10-CM | POA: Diagnosis not present

## 2019-10-10 DIAGNOSIS — E782 Mixed hyperlipidemia: Secondary | ICD-10-CM | POA: Diagnosis not present

## 2019-10-10 DIAGNOSIS — I2510 Atherosclerotic heart disease of native coronary artery without angina pectoris: Secondary | ICD-10-CM | POA: Diagnosis not present

## 2019-10-10 DIAGNOSIS — R002 Palpitations: Secondary | ICD-10-CM

## 2019-10-10 MED ORDER — METOPROLOL SUCCINATE ER 25 MG PO TB24: 25.0000 mg | ORAL_TABLET | Freq: Every day | ORAL | 3 refills | Status: DC

## 2019-10-10 NOTE — Progress Notes (Signed)
OFFICE NOTE  Chief Complaint:  Chest pain  Primary Care Physician: Chesley Noon, MD  HPI:  Vincent Summers is a pleasant 59 year old male with a strong family history of coronary disease. He reports that his father had heart disease and prior bypass as well as a defibrillator implanted and died at age 48. There is also extensive heart disease and no sisters who have arrhythmias and a pacemaker as well as in his grandparents. He does have a history of anxiety is well. He's recently had some chest pain symptoms which are reminiscent of pain these had many years in the past. In 2007 he complained of some chest pain and underwent cardiac catheterization by Dr. Haroldine Laws which demonstrated essentially normal coronary arteries with normal LV function. He's developed some mild hypertension and is on medication for that but was told that he has good cholesterol control with a total cholesterol less than 200. Recently he's been having some discomfort in his chest and tingling in his left arm. The symptoms are little bit worse with exertion and relieved somewhat by rest but can occur at other times without doing activities. 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  Vincent Summers returns today for follow-up. He underwent an exercise stress test which was negative for ischemia but did show hypertensive response. He had a CT calcium score which resulted is 0. This is highly suggestive of low risk of coronary events. Incidentally, it was noted by the radiologist that there is a nonalcoholic fatty liver disease. It is noted that his cholesterol is elevated and he reports drinking a couple of beers daily after work.  06/22/2019  Vincent Summers is seen today for acute chest pain.  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  This is described as substernal nonradiating but more intense than any pain has had before.  I last saw him for this and  evaluated him in January 2016 therefore he is considered a new patient.  As mentioned above he underwent cardiac catheterization by Dr. Haroldine Laws in 2007 which showed essentially normal coronaries.  10/10/2019  Vincent Summers returns today for follow-up.  He still gets some intermittent episodes of chest discomfort.  He underwent CT coronary angiography which demonstrated calcium score 49.4 and mild noncalcified coronary disease of all the major coronaries.  This is actually some progression of disease which is significant from his calcium score which was 0 in 2016 and essentially normal coronaries back by heart catheterization in 07.  Given a strong family history of heart disease, I suspect there is another reason why has had progression of his coronary disease.  He had repeat lipids from his PCP yesterday which showed total cholesterol 128, triglycerides 154, HDL 39 and LDL 62.  This represents excellent control and he is on a low-dose statin.  He was able to tolerate 50 mg of metoprolol for CT.  He also reports recently has been having some more palpitations.  PMHx:  Past Medical History:  Diagnosis Date  . Anxiety   . Arthritis   . GERD (gastroesophageal reflux disease)   . History of hiatal hernia   . Hypercholesteremia   . Hypertension   . NAFL (nonalcoholic fatty liver)   . Palpitations   . Rosacea   . Spinal stenosis at L4-L5 level     Past Surgical History:  Procedure Laterality Date  . APPENDECTOMY    . CARDIAC CATHETERIZATION  2007   Dr. Haroldine Laws  . INSERTION OF MESH N/A 11/28/2018   Procedure: Insertion Of Mesh;  Surgeon: Donnie Mesa, MD;  Location: [place];  Service: General;  Laterality: N/A;  . KNEE SURGERY    . SHOULDER SURGERY    . TONSILLECTOMY    . VENTRAL HERNIA REPAIR N/A 11/28/2018   Procedure: LAPAROSCOPIC VENTRAL HERNIA REPAIR;  Surgeon: Donnie Mesa, MD;  Location: [place];  Service: General;  Laterality: N/A;    FAMHx:  Family History  Problem Relation Age of  Onset  . Emphysema Mother   . Skin cancer Mother   . Leukemia Father   . Heart disease Father 30       CAD---CABG @ age 66  . Arrhythmia Father   . Arrhythmia Sister   . Stroke Maternal Grandmother 90  . Heart disease Maternal Grandfather   . Stroke Maternal Grandfather 90  . Hypertension Paternal Grandmother   . Stroke Paternal Grandmother 85  . Heart disease Paternal Grandfather 77    SOCHx:   reports that he has never smoked. He has never used smokeless tobacco. He reports current alcohol use of about 14.0 standard drinks of alcohol per week. He reports that he does not use drugs.  ALLERGIES:  Allergies  Allergen Reactions  . Fenofibrate Other (See Comments)    Back pain    ROS: A comprehensive review of systems was negative.  HOME MEDS: Current Outpatient Medications  Medication Sig Dispense Refill  . acetaminophen (TYLENOL) 500 MG tablet Take 1,000 mg by mouth every 6 (six) hours as needed for moderate pain or headache.    . ALPRAZolam (XANAX) 0.5 MG tablet Take 0.5 mg by mouth at bedtime as needed for anxiety. TAKE 3 TIMES DAILY PRN    . atorvastatin (LIPITOR) 20 MG tablet Take 1 tablet (20 mg total) by mouth daily. 90 tablet 3  . benazepril (LOTENSIN) 40 MG tablet Take 40 mg by mouth daily at 12 noon.    Marland Kitchen esomeprazole (NEXIUM) 40 MG capsule Take 40 mg by mouth daily at 12 noon.    . gabapentin (NEURONTIN) 300 MG capsule Take 300 mg by mouth 2 (two) times daily.    . hydrochlorothiazide (HYDRODIURIL) 25 MG tablet Take 1 tablet (25 mg total) by mouth daily. (Patient taking differently: Take 25 mg by mouth daily at 12 noon. ) 30 tablet 0  . metoprolol tartrate (LOPRESSOR) 50 MG tablet Take 1 tablet by mouth once for procedure. 1 tablet 0  . minocycline (MINOCIN,DYNACIN) 100 MG capsule Take 1 capsule (100 mg total) by mouth 2 (two) times daily. 180 capsule 1  . sildenafil (REVATIO) 20 MG tablet Take 20-60 mg by mouth daily as needed (ED).    . traMADol (ULTRAM) 50 MG  tablet Take 50 mg by mouth every 6 (six) hours as needed.    . trimethoprim-polymyxin b (POLYTRIM) ophthalmic solution Place 1 drop into both eyes every 6 (six) hours. 10 mL 0   No current facility-administered medications for this visit.    LABS/IMAGING: No results found for this or any previous visit (from the past 48 hour(s)). No results found.  VITALS: BP (!) 142/88   Pulse 71   Ht 6\' 1"  (1.854 m)   Wt 248 lb (112.5 kg)   SpO2 98%   BMI 32.72 kg/m   EXAM: General appearance: alert and no distress Neck: no carotid bruit, no JVD and thyroid not enlarged, symmetric, no tenderness/mass/nodules Lungs: clear to auscultation bilaterally Heart: regular rate  and rhythm Abdomen: soft, non-tender; bowel sounds normal; no masses,  no organomegaly Extremities: extremities normal, atraumatic, no cyanosis or edema Pulses: 2+ and symmetric Skin: Skin color, texture, turgor normal. No rashes or lesions Neurologic: Grossly normal Psych: Pleasant  EKG: Deferred  ASSESSMENT: 1. Chest pain -CT coronary angiogram showed mild multivessel coronary disease with calcium score 49 (06/2019) 2. History of more atypical chest pain - negative exercise stress test (2016)- Zero calcium score 3. 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 Strong family history of coronary disease 5. Hypertension 6. NAFLD  PLAN: 1.   Vincent Summers seems to have had some progression of his coronary artery disease but I do not think it is causing him his symptoms.  He has also been having some palpitations.  Since there is indication for beta-blocker based on coronary disease, his blood pressure was a little higher than normal today and heart rate is in the 70s, I think he would benefit from being on a beta-blocker.  Would recommend starting Toprol-XL 25 mg daily.  He will continue on his other hypertensive medications.  He will continue on his current dose of atorvastatin but I like to obtain an LP(a) to see if this might explain why he  has had progression of his coronary disease despite a fairly normal lipid profile.  His father who had multiple bypass surgeries apparently also had a pretty benign lipid profile suggesting there may be other factors causing his aggressive coronary disease.  Pixie Casino, MD, [facility], [place] Director of the Advanced Lipid Disorders &  Cardiovascular Risk Reduction Clinic Diplomate of the American Board of Clinical Lipidology Attending Cardiologist  Direct Dial: [phone]  Fax: [phone]  Website:  [email or web address] 10/10/2019, 4:17 PM

## 2019-10-10 NOTE — Patient Instructions (Signed)
Medication Instructions:  START metoprolol succinate 25mg  daily  *If you need a refill on your cardiac medications before your next appointment, please call your pharmacy*   Lab Work: LP(a) lab test   If you have labs (blood work) drawn today and your tests are completely normal, you will receive your results only by: Marland Kitchen MyChart Message (if you have MyChart) OR . A paper copy in the mail If you have any lab test that is abnormal or we need to change your treatment, we will call you to review the results.   Testing/Procedures: NONE   Follow-Up: At [facility], you and your health needs are our priority.  As part of our continuing mission to provide you with exceptional heart care, we have created designated Provider Care Teams.  These Care Teams include your primary Cardiologist (physician) and Advanced Practice Providers (APPs -  Physician Assistants and Nurse Practitioners) who all work together to provide you with the care you need, when you need it.  We recommend signing up for the patient portal called "MyChart".  Sign up information is provided on this After Visit Summary.  MyChart is used to connect with patients for Virtual Visits (Telemedicine).  Patients are able to view lab/test results, encounter notes, upcoming appointments, etc.  Non-urgent messages can be sent to your provider as well.   To learn more about what you can do with MyChart, go to [email or web address].    Your next appointment:   6 month(s)  The format for your next appointment:   In Person  Provider:   You may see Pixie Casino, MD or one of the following Advanced Practice Providers on your designated Care Team:    Almyra Deforest, PA-C  Fabian Sharp, PA-C or   Roby Lofts, [place]    Other Instructions

## 2019-10-12 LAB — LIPOPROTEIN A (LPA): Lipoprotein (a): 26.1 nmol/L (ref <75.0)

## 2020-04-15 ENCOUNTER — Ambulatory Visit (INDEPENDENT_AMBULATORY_CARE_PROVIDER_SITE_OTHER): Payer: BC Managed Care – PPO | Admitting: Internal Medicine

## 2020-04-15 ENCOUNTER — Other Ambulatory Visit: Payer: Self-pay

## 2020-04-15 ENCOUNTER — Encounter: Payer: Self-pay | Admitting: Internal Medicine

## 2020-04-15 VITALS — BP 140/73 | HR 68 | Ht 73.0 in | Wt 247.4 lb

## 2020-04-15 DIAGNOSIS — I2510 Atherosclerotic heart disease of native coronary artery without angina pectoris: Secondary | ICD-10-CM

## 2020-04-15 DIAGNOSIS — E782 Mixed hyperlipidemia: Secondary | ICD-10-CM

## 2020-04-15 DIAGNOSIS — I10 Essential (primary) hypertension: Secondary | ICD-10-CM

## 2020-04-15 DIAGNOSIS — Z8249 Family history of ischemic heart disease and other diseases of the circulatory system: Secondary | ICD-10-CM | POA: Diagnosis not present

## 2020-04-15 MED ORDER — ASPIRIN EC 81 MG PO TBEC: 81.0000 mg | DELAYED_RELEASE_TABLET | Freq: Every day | ORAL | 3 refills | Status: DC

## 2020-04-15 NOTE — Patient Instructions (Signed)
Medication Instructions:   START ASPIRIN 81 MG ONCE DAILY  *If you need a refill on your cardiac medications before your next appointment, please call your pharmacy*   Follow-Up: At [facility], you and your health needs are our priority.  As part of our continuing mission to provide you with exceptional heart care, we have created designated Provider Care Teams.  These Care Teams include your primary Cardiologist (physician) and Advanced Practice Providers (APPs -  Physician Assistants and Nurse Practitioners) who all work together to provide you with the care you need, when you need it.  We recommend signing up for the patient portal called "MyChart".  Sign up information is provided on this After Visit Summary.  MyChart is used to connect with patients for Virtual Visits (Telemedicine).  Patients are able to view lab/test results, encounter notes, upcoming appointments, etc.  Non-urgent messages can be sent to your provider as well.   To learn more about what you can do with MyChart, go to [email or web address].    Your next appointment:   12 month(s)  The format for your next appointment:   In Person  Provider:   K. Mali Hilty, MD

## 2020-04-15 NOTE — Progress Notes (Signed)
OFFICE NOTE  Chief Complaint:  Chest pain  Primary Care Physician: Chesley Noon, MD  HPI:  Vincent Summers is a pleasant 60 year old male with a strong family history of coronary disease. He reports that his father had heart disease and prior bypass as well as a defibrillator implanted and died at age 53. There is also extensive heart disease and no sisters who have arrhythmias and a pacemaker as well as in his grandparents. He does have a history of anxiety is well. He's recently had some chest pain symptoms which are reminiscent of pain these had many years in the past. In 2007 he complained of some chest pain and underwent cardiac catheterization by Dr. Haroldine Laws which demonstrated essentially normal coronary arteries with normal LV function. He's developed some mild hypertension and is on medication for that but was told that he has good cholesterol control with a total cholesterol less than 200. Recently he's been having some discomfort in his chest and tingling in his left arm. The symptoms are little bit worse with exertion and relieved somewhat by rest but can occur at other times without doing activities. 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  Vincent Summers returns today for follow-up. He underwent an exercise stress test which was negative for ischemia but did show hypertensive response. He had a CT calcium score which resulted is 0. This is highly suggestive of low risk of coronary events. Incidentally, it was noted by the radiologist that there is a nonalcoholic fatty liver disease. It is noted that his cholesterol is elevated and he reports drinking a couple of beers daily after work.  06/22/2019  Vincent Summers is seen today for acute chest pain.  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  This is described as substernal nonradiating but more intense than any pain has had before.  I last saw him for this and  evaluated him in January 2016 therefore he is considered a new patient.  As mentioned above he underwent cardiac catheterization by Dr. Haroldine Laws in 2007 which showed essentially normal coronaries.  10/10/2019  Vincent Summers returns today for follow-up.  He still gets some intermittent episodes of chest discomfort.  He underwent CT coronary angiography which demonstrated calcium score 49.4 and mild noncalcified coronary disease of all the major coronaries.  This is actually some progression of disease which is significant from his calcium score which was 0 in 2016 and essentially normal coronaries back by heart catheterization in 07.  Given a strong family history of heart disease, I suspect there is another reason why has had progression of his coronary disease.  He had repeat lipids from his PCP yesterday which showed total cholesterol 128, triglycerides 154, HDL 39 and LDL 62.  This represents excellent control and he is on a low-dose statin.  He was able to tolerate 50 mg of metoprolol for CT.  He also reports recently has been having some more palpitations.  04/15/2020  Vincent Summers returns today for follow-up.  Intermittently has episodes of chest discomfort still despite recent CT coronary angiogram last year that showed low calcium score 49 he does have coronary disease to multiple vessels however mild.  This is a progression from a calcium score which was 0 in 2016 and mild coronary disease in 2007 by heart cath.  He does have a strong family history of heart disease.  LP(a) was drawn which was negative.  His lipids generally show good control.  I encouraged more exercise and activity.  I would also like him to be on low-dose aspirin which she does not currently take.  PMHx:  Past Medical History:  Diagnosis Date  . Anxiety   . Arthritis   . GERD (gastroesophageal reflux disease)   . History of hiatal hernia   . Hypercholesteremia   . Hypertension   . NAFL (nonalcoholic fatty liver)   .  Palpitations   . Rosacea   . Spinal stenosis at L4-L5 level     Past Surgical History:  Procedure Laterality Date  . APPENDECTOMY    . CARDIAC CATHETERIZATION  2007   Dr. Haroldine Laws  . INSERTION OF MESH N/A 11/28/2018   Procedure: Insertion Of Mesh;  Surgeon: Donnie Mesa, MD;  Location: [facility];  Service: General;  Laterality: N/A;  . KNEE SURGERY    . SHOULDER SURGERY    . TONSILLECTOMY    . VENTRAL HERNIA REPAIR N/A 11/28/2018   Procedure: LAPAROSCOPIC VENTRAL HERNIA REPAIR;  Surgeon: Donnie Mesa, MD;  Location: [facility];  Service: General;  Laterality: N/A;    FAMHx:  Family History  Problem Relation Age of Onset  . Emphysema Mother   . Skin cancer Mother   . Leukemia Father   . Heart disease Father 69       CAD---CABG @ age 37  . Arrhythmia Father   . Arrhythmia Sister   . Stroke Maternal Grandmother 90  . Heart disease Maternal Grandfather   . Stroke Maternal Grandfather 85  . Hypertension Paternal Grandmother   . Stroke Paternal Grandmother 85  . Heart disease Paternal Grandfather 32    SOCHx:   reports that he has never smoked. He has never used smokeless tobacco. He reports current alcohol use of about 14.0 standard drinks of alcohol per week. He reports that he does not use drugs.  ALLERGIES:  Allergies  Allergen Reactions  . Fenofibrate Other (See Comments)    Back pain    ROS: A comprehensive review of systems was negative.  HOME MEDS: Current Outpatient Medications  Medication Sig Dispense Refill  . acetaminophen (TYLENOL) 500 MG tablet Take 1,000 mg by mouth every 6 (six) hours as needed for moderate pain or headache.    . ALPRAZolam (XANAX) 0.5 MG tablet Take 0.5 mg by mouth at bedtime as needed for anxiety. TAKE 3 TIMES DAILY PRN    . benazepril (LOTENSIN) 40 MG tablet Take 40 mg by mouth daily at 12 noon.    Marland Kitchen esomeprazole (NEXIUM) 40 MG capsule Take 40 mg by mouth daily at 12 noon.    . gabapentin (NEURONTIN) 300 MG capsule Take 300 mg by mouth 2  (two) times daily.    . hydrochlorothiazide (HYDRODIURIL) 25 MG tablet Take 1 tablet (25 mg total) by mouth daily. (Patient taking differently: Take 25 mg by mouth daily at 12 noon.) 30 tablet 0  . metoprolol succinate (TOPROL-XL) 25 MG 24 hr tablet Take 1 tablet (25 mg total) by mouth daily. 90 tablet 3  . metoprolol tartrate (LOPRESSOR) 50 MG tablet Take 1 tablet by mouth once for procedure. 1 tablet 0  . minocycline (MINOCIN,DYNACIN) 100 MG capsule Take 1 capsule (100 mg total) by mouth 2 (two) times daily. 180 capsule 1  . sildenafil (REVATIO) 20 MG tablet Take 20-60 mg by mouth daily as needed (ED).    . traMADol (ULTRAM) 50 MG tablet Take 50 mg by mouth every 6 (six) hours as needed.    Marland Kitchen  trimethoprim-polymyxin b (POLYTRIM) ophthalmic solution Place 1 drop into both eyes every 6 (six) hours. 10 mL 0  . atorvastatin (LIPITOR) 20 MG tablet Take 1 tablet (20 mg total) by mouth daily. 90 tablet 3   No current facility-administered medications for this visit.    LABS/IMAGING: No results found for this or any previous visit (from the past 48 hour(s)). No results found.  VITALS: BP 140/73   Pulse 68   Ht 6\' 1"  (1.854 m)   Wt 247 lb 6.4 oz (112.2 kg)   SpO2 97%   BMI 32.64 kg/m   EXAM: General appearance: alert and no distress Neck: no carotid bruit, no JVD and thyroid not enlarged, symmetric, no tenderness/mass/nodules Lungs: clear to auscultation bilaterally Heart: regular rate and rhythm Abdomen: soft, non-tender; bowel sounds normal; no masses,  no organomegaly Extremities: extremities normal, atraumatic, no cyanosis or edema Pulses: 2+ and symmetric Skin: Skin color, texture, turgor normal. No rashes or lesions Neurologic: Grossly normal Psych: Pleasant  EKG: Normal sinus rhythm 68-personally reviewed  ASSESSMENT: 1. Chest pain -CT coronary angiogram showed mild multivessel coronary disease with calcium score 49 (06/2019) 2. History of more atypical chest pain - negative  exercise stress test (2016)- Zero calcium score 3. 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 Strong family history of coronary disease 5. Hypertension 6. Dyslipidemia 7. NAFLD  PLAN: 1.   Vincent Summers has a history of multivessel coronary calcification without any significant obstruction.  He continues to have episodes of what sound like atypical chest pain.  Since he does have some mild coronary disease I recommend starting aspirin 81 mg daily.  We will need to continue his current regimen regimen.  Target LDL less than 70.  Blood pressure is decent today.  He needs more physical activity and exercise which he is working on.  He says he is transitioning from work at nights to days.  Plan follow-up with me annually or sooner as necessary.  Pixie Casino, MD, [facility], [place] Director of the [facility]  [facility] Diplomate of the American Board of Clinical Lipidology Attending Cardiologist  Direct Dial: [phone]  Fax: [phone]  Website:  [email or web address] 04/15/2020, 1:37 PM

## 2020-04-17 NOTE — Addendum Note (Signed)
Addended by: Lubertha Sayres on: 04/17/2020 04:23 PM   Modules accepted: Orders

## 2020-05-07 IMAGING — CT CT HEART MORP W/ CTA COR W/ SCORE W/ CA W/CM &/OR W/O CM
4 of 7 series · 8 of 20 positions shown, 9 images · IV contrast (APPLIED)
Comparison: 04/02/2014

Addendum:
CLINICAL DATA: Chest pain

EXAM:
Cardiac CTA
MEDICATIONS:
Sub lingual nitro.  4mg
TECHNIQUE: The patient was scanned on a Siemens Force [REDACTED]ice scanner. Gantry
rotation speed was 250 msecs. Collimation was .6 mm. A 100 kV
prospective scan was triggered in the ascending thoracic aorta at
140 HU's Full mA was used between 35% and 75% of the R-R interval.
Average HR during the scan was 54 bpm. The 3D data set was
interpreted on a dedicated work station using MPR, MIP and VRT
modes. A total of 80 cc of contrast was used.

[Series 6: best diast 74 % · axial · 0.39mm/px · z∈[+1267,+1320]mm · 2 of 398 slices shown, 3 images]
[im 133/398  vessel]
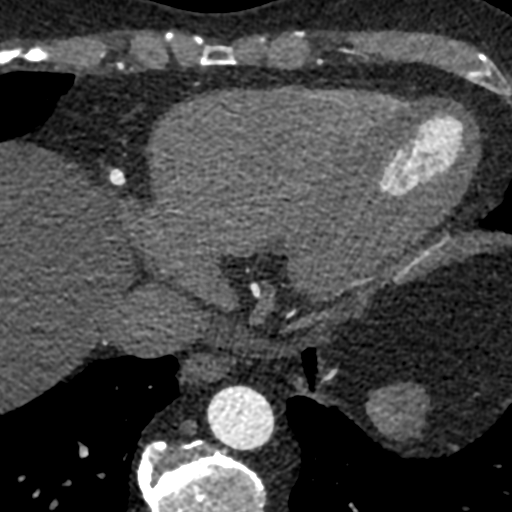
[im 133/398  lung]
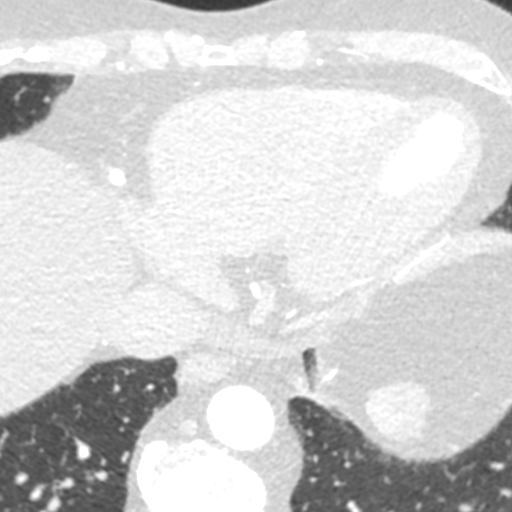
[im 265/398  vessel]
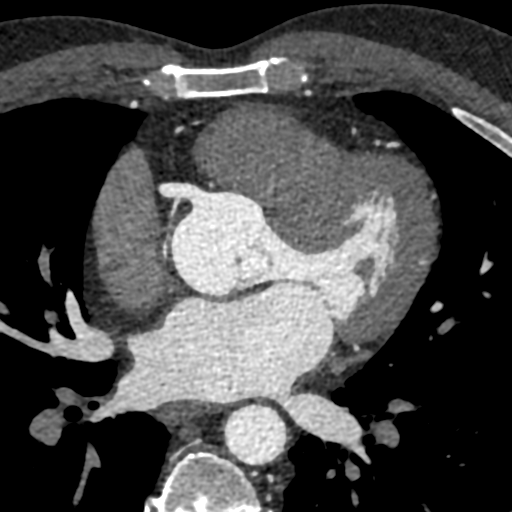

[Series 7: best syst 34 % · axial · 0.39mm/px · z∈[+1267,+1320]mm · 2 of 398 slices shown]
[im 133/398  vessel]
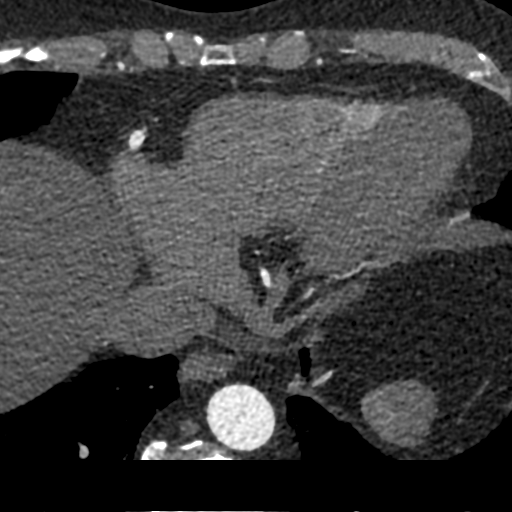
[im 265/398  vessel]
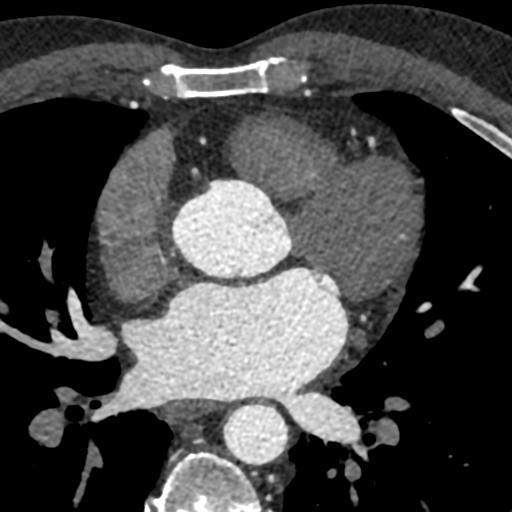

[Series 8: ts diast sharp 34 % · axial · 0.39mm/px · z∈[+1267,+1320]mm · 2 of 398 slices shown]
[im 133/398  lung]
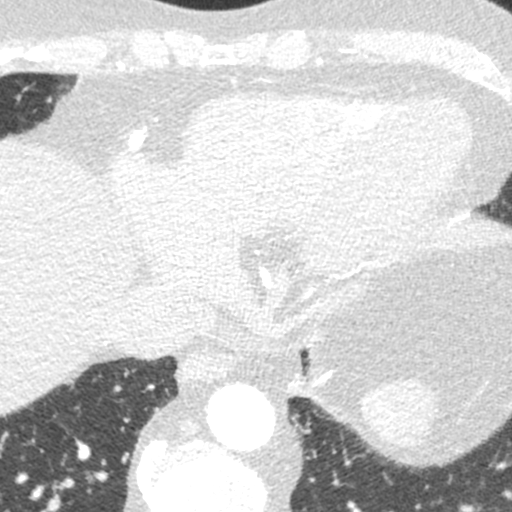
[im 265/398  lung]
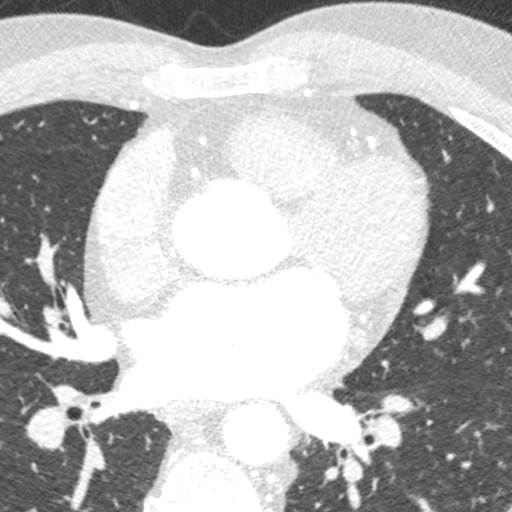

[Series 9: ts syst sharp 34 % · axial · 0.39mm/px · z∈[+1267,+1320]mm · 2 of 398 slices shown]
[im 133/398  lung]
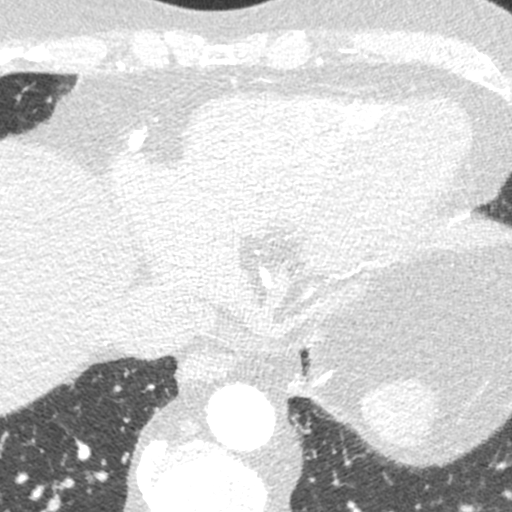
[im 265/398  lung]
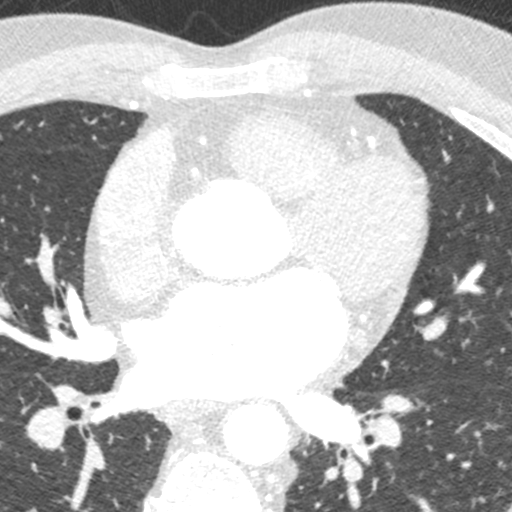

[8 of 20 positions shown; findings below may reference images not displayed]

FINDINGS: Non-cardiac: See separate report from [REDACTED]. No
significant findings on limited lung and soft tissue windows.

Calcium Score: Calcium noted in LM/LAD and diagonal system

Coronary Arteries: Right dominant with no anomalies

LM: 1-24% calcific ostial stenosis

LAD: 1-24% proximal and mid calcific stenosis

D1: 1-24% calcific stenosis

D2: 1-24% calcific stenosis

Circumflex: Normal

OM1: Normal

OM2: Normal

RCA: Normal

PDA: Normal

PLA: Normal
IMPRESSION: 1. Calcium score 49.4 which is 65 th percentile for age and sex

2.  Normal aortic root diameter 3.5 cm

3.  CAD RADS 1 non obstructive CAD see description above

Dleke Tiger

ADDENDUM:
OVER-READ INTERPRETATION  CT CHEST

The following report is an over-read performed by radiologist Dr.
does not include interpretation of cardiac or coronary anatomy or
pathology. The coronary CTA interpretation by the cardiologist is
attached.
FINDINGS: No pleural fluid.  Clear imaged lungs.

Normal aortic caliber. No central pulmonary embolism, on this
non-dedicated study.No imaged thoracic adenopathy.

Hepatic steatosis.  Normal imaged portions of the spleen, stomach.

No acute osseous abnormality.
IMPRESSION: 1.  No acute findings in the imaged extracardiac chest.
2. Hepatic steatosis.

*** End of Addendum ***
FINDINGS: Non-cardiac: See separate report from [REDACTED]. No
significant findings on limited lung and soft tissue windows.

Calcium Score: Calcium noted in LM/LAD and diagonal system

Coronary Arteries: Right dominant with no anomalies

LM: 1-24% calcific ostial stenosis

LAD: 1-24% proximal and mid calcific stenosis

D1: 1-24% calcific stenosis

D2: 1-24% calcific stenosis

Circumflex: Normal

OM1: Normal

OM2: Normal

RCA: Normal

PDA: Normal

PLA: Normal
IMPRESSION: 1. Calcium score 49.4 which is 65 th percentile for age and sex

2.  Normal aortic root diameter 3.5 cm

3.  CAD RADS 1 non obstructive CAD see description above

Dleke Tiger

## 2020-07-22 ENCOUNTER — Other Ambulatory Visit: Payer: Self-pay | Admitting: Physician Assistant

## 2020-07-22 NOTE — Telephone Encounter (Signed)
Rx(s) sent to pharmacy electronically.  

## 2020-09-17 ENCOUNTER — Other Ambulatory Visit: Payer: Self-pay | Admitting: Internal Medicine

## 2021-02-23 ENCOUNTER — Telehealth: Payer: Self-pay | Admitting: Internal Medicine

## 2021-02-23 NOTE — Telephone Encounter (Signed)
Attempted to contact patient to discuss chest discomfort concerns.  Left voicemail to call back, left call back number.

## 2021-02-23 NOTE — Telephone Encounter (Signed)
This was sent to the Scheduling pool Via Mychart:       From:Jere M Mccullars "Vincent Summers"      Sent:02/23/2021  1:18 PM EST        UP:[id number] Appointment Schedule Request Message List   Subject:Appointment Request  Currently I am not experiencing chest pains just a little discomfort, they come and go.   At times I do get nausea but no throwing up and back and arm will have discomfort. These pains come and go but at times it is so intense I cannot cat h my breathe.   I do not take nitro.   ----- Message -----      From:Shana S      Sent:02/23/2021  9:21 AM EST        OE:[id number] M Bartnick "Vincent Summers"   Subject:Appointment Request  Good Morning Vincent Summers.   Can you tell me a little bit more about the chest pains that you have been having?      1. Are you having CP right now?   2. Are you experiencing any other symptoms (ex. SOB, nausea, vomiting, sweating)?   3. How long have you been experiencing CP?   4. Is your CP continuous or coming and going?   5. Have you taken Nitroglycerin?  ?    ----- Message -----      From:Jawuan M Strebeck "Vincent Summers"      Sent:02/20/2021  2:44 PM EST        SK:[id number] Wells Guiles, MD   Subject:Appointment Request  Appointment Request From: Vincent Summers  With Provider: Pixie Casino, MD [facility]]  Preferred Date Range: 02/20/2021 - 04/21/2021  Preferred Times: Any Time  Reason for visit: Office Visit  Comments: chest pains/check-up

## 2021-02-25 NOTE — Telephone Encounter (Signed)
Spoke with pt, he reports for about a month now he ha been having off and on chest pain. He reports it last the longest 5 to 10 min. He denies sweating, SOB or other symptoms during the pain. The last episode he had was during a very stressful situation. He gets this discomfort at different times about every couple weeks. Follow up scheduled for patient 03/24/21 and ER precautions discussed with the patient.

## 2021-02-27 DIAGNOSIS — Z8719 Personal history of other diseases of the digestive system: Secondary | ICD-10-CM | POA: Insufficient documentation

## 2021-03-24 ENCOUNTER — Ambulatory Visit (INDEPENDENT_AMBULATORY_CARE_PROVIDER_SITE_OTHER): Payer: 59 | Admitting: Internal Medicine

## 2021-03-24 ENCOUNTER — Other Ambulatory Visit: Payer: Self-pay

## 2021-03-24 ENCOUNTER — Encounter: Payer: Self-pay | Admitting: Internal Medicine

## 2021-03-24 VITALS — BP 143/85 | HR 65 | Ht 72.0 in | Wt 238.8 lb

## 2021-03-24 DIAGNOSIS — R002 Palpitations: Secondary | ICD-10-CM

## 2021-03-24 DIAGNOSIS — R079 Chest pain, unspecified: Secondary | ICD-10-CM

## 2021-03-24 DIAGNOSIS — E782 Mixed hyperlipidemia: Secondary | ICD-10-CM

## 2021-03-24 DIAGNOSIS — I2510 Atherosclerotic heart disease of native coronary artery without angina pectoris: Secondary | ICD-10-CM

## 2021-03-24 NOTE — Progress Notes (Signed)
OFFICE NOTE  Chief Complaint:  Chest pain  Primary Care Physician: Chesley Noon, MD  HPI:  Vincent Summers is a pleasant 61 year old male with a strong family history of coronary disease. He reports that his father had heart disease and prior bypass as well as a defibrillator implanted and died at age 73. There is also extensive heart disease and no sisters who have arrhythmias and a pacemaker as well as in his grandparents. He does have a history of anxiety is well. He's recently had some chest pain symptoms which are reminiscent of pain these had many years in the past. In 2007 he complained of some chest pain and underwent cardiac catheterization by Dr. Haroldine Laws which demonstrated essentially normal coronary arteries with normal LV function. He's developed some mild hypertension and is on medication for that but was told that he has good cholesterol control with a total cholesterol less than 200. Recently he's been having some discomfort in his chest and tingling in his left arm. The symptoms are little bit worse with exertion and relieved somewhat by rest but can occur at other times without doing activities. 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  Vincent Summers returns today for follow-up. He underwent an exercise stress test which was negative for ischemia but did show hypertensive response. He had a CT calcium score which resulted is 0. This is highly suggestive of low risk of coronary events. Incidentally, it was noted by the radiologist that there is a nonalcoholic fatty liver disease. It is noted that his cholesterol is elevated and he reports drinking a couple of beers daily after work.  06/22/2019  Vincent Summers is seen today for acute chest pain.  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  This is described as substernal nonradiating but more intense than any pain has had before.  I last saw him for this and  evaluated him in January 2016 therefore he is considered a new patient.  As mentioned above he underwent cardiac catheterization by Dr. Haroldine Laws in 2007 which showed essentially normal coronaries.  10/10/2019  Vincent Summers returns today for follow-up.  He still gets some intermittent episodes of chest discomfort.  He underwent CT coronary angiography which demonstrated calcium score 49.4 and mild noncalcified coronary disease of all the major coronaries.  This is actually some progression of disease which is significant from his calcium score which was 0 in 2016 and essentially normal coronaries back by heart catheterization in 07.  Given a strong family history of heart disease, I suspect there is another reason why has had progression of his coronary disease.  He had repeat lipids from his PCP yesterday which showed total cholesterol 128, triglycerides 154, HDL 39 and LDL 62.  This represents excellent control and he is on a low-dose statin.  He was able to tolerate 50 mg of metoprolol for CT.  He also reports recently has been having some more palpitations.  04/15/2020  Vincent Summers returns today for follow-up.  Intermittently has episodes of chest discomfort still despite recent CT coronary angiogram last year that showed low calcium score 49 he does have coronary disease to multiple vessels however mild.  This is a progression from a calcium score which was 0 in 2016 and mild coronary disease in 2007 by heart cath.  He does have a strong family history of heart disease.  LP(a) was drawn which was negative.  His lipids generally show good control.  I encouraged more exercise and activity.  I would also like him to be on low-dose aspirin which she does not currently take.  03/24/2021  Vincent Summers returns today for follow-up.  He seems to be doing fairly well except for one episode that he experienced at work of left-sided chest discomfort.  It was somewhat dull such as a pressure and did not radiate.  It came on  and lasted for about 5 to 10 minutes when he was in a "breakout session" at work.  He has not had episodes since then.  He did have a CT coronary angiogram last April which showed minimal coronary disease.  He denies any worsening chest pain or shortness of breath with exertion and is physically active at his job walking probably 7 to 9 miles a day  PMHx:  Past Medical History:  Diagnosis Date   Anxiety    Arthritis    GERD (gastroesophageal reflux disease)    History of hiatal hernia    Hypercholesteremia    Hypertension    NAFL (nonalcoholic fatty liver)    Palpitations    Rosacea    Spinal stenosis at L4-L5 level     Past Surgical History:  Procedure Laterality Date   APPENDECTOMY     CARDIAC CATHETERIZATION  2007   Dr. Haroldine Laws   INSERTION OF MESH N/A 11/28/2018   Procedure: Insertion Of Mesh;  Surgeon: Donnie Mesa, MD;  Location: [facility];  Service: General;  Laterality: N/A;   KNEE SURGERY     SHOULDER SURGERY     TONSILLECTOMY     VENTRAL HERNIA REPAIR N/A 11/28/2018   Procedure: LAPAROSCOPIC VENTRAL HERNIA REPAIR;  Surgeon: Donnie Mesa, MD;  Location: [facility];  Service: General;  Laterality: N/A;    FAMHx:  Family History  Problem Relation Age of Onset   Emphysema Mother    Skin cancer Mother    Leukemia Father    Heart disease Father 28       CAD---CABG @ age 69   Arrhythmia Father    Arrhythmia Sister    Stroke Maternal Grandmother 90   Heart disease Maternal Grandfather    Stroke Maternal Grandfather 82   Hypertension Paternal Grandmother    Stroke Paternal Grandmother 85   Heart disease Paternal Grandfather 70    SOCHx:   reports that he has never smoked. He has never used smokeless tobacco. He reports current alcohol use of about 14.0 standard drinks per week. He reports that he does not use drugs.  ALLERGIES:  Allergies  Allergen Reactions   Fenofibrate Other (See Comments)    Back pain    ROS: A comprehensive review of systems was  negative.  HOME MEDS: Current Outpatient Medications  Medication Sig Dispense Refill   acetaminophen (TYLENOL) 500 MG tablet Take 1,000 mg by mouth every 6 (six) hours as needed for moderate pain or headache.     ALPRAZolam (XANAX) 0.5 MG tablet Take 0.5 mg by mouth at bedtime as needed for anxiety. TAKE 3 TIMES DAILY PRN     aspirin EC 81 MG tablet Take 1 tablet (81 mg total) by mouth daily. Swallow whole. 90 tablet 3   atorvastatin (LIPITOR) 20 MG tablet TAKE 1 TABLET BY MOUTH EVERY DAY 90 tablet 2   benazepril (LOTENSIN) 40 MG tablet Take 40 mg by mouth daily at 12 noon.     esomeprazole (NEXIUM) 40 MG capsule Take 40 mg by mouth daily at 12  noon.     gabapentin (NEURONTIN) 300 MG capsule Take 300 mg by mouth 2 (two) times daily.     hydrochlorothiazide (HYDRODIURIL) 25 MG tablet Take 1 tablet (25 mg total) by mouth daily. (Patient taking differently: Take 25 mg by mouth daily at 12 noon.) 30 tablet 0   metoprolol succinate (TOPROL-XL) 25 MG 24 hr tablet TAKE 1 TABLET BY MOUTH EVERY DAY 90 tablet 3   metoprolol tartrate (LOPRESSOR) 50 MG tablet Take 1 tablet by mouth once for procedure. 1 tablet 0   minocycline (MINOCIN,DYNACIN) 100 MG capsule Take 1 capsule (100 mg total) by mouth 2 (two) times daily. 180 capsule 1   rizatriptan (MAXALT) 10 MG tablet TAKE 1 TABLET BY MOUTH ONCE AS NEEDED FOR MIGRAINE FOR UP TO 1 DOSE. MAY REPEAT IN 2 HOURS IF NEEDED     sildenafil (REVATIO) 20 MG tablet Take 20-60 mg by mouth daily as needed (ED).     traMADol (ULTRAM) 50 MG tablet Take 50 mg by mouth every 6 (six) hours as needed.     trimethoprim-polymyxin b (POLYTRIM) ophthalmic solution Place 1 drop into both eyes every 6 (six) hours. 10 mL 0   No current facility-administered medications for this visit.    LABS/IMAGING: No results found for this or any previous visit (from the past 48 hour(s)). No results found.  VITALS: Ht 6' (1.829 m)    Wt 238 lb 12.8 oz (108.3 kg)    SpO2 99%    BMI 32.39  kg/m   EXAM: General appearance: alert and no distress Neck: no carotid bruit, no JVD and thyroid not enlarged, symmetric, no tenderness/mass/nodules Lungs: clear to auscultation bilaterally Heart: regular rate and rhythm Abdomen: soft, non-tender; bowel sounds normal; no masses,  no organomegaly Extremities: extremities normal, atraumatic, no cyanosis or edema Pulses: 2+ and symmetric Skin: Skin color, texture, turgor normal. No rashes or lesions Neurologic: Grossly normal Psych: Pleasant  EKG: Sinus rhythm at 65 -personally reviewed  ASSESSMENT: Chest pain -CT coronary angiogram showed mild multivessel coronary disease with calcium score 49 (06/2019) History of more atypical chest pain - negative exercise stress test (2016)- Zero calcium score Minimal CAD by cath in 2007 Strong family history of coronary disease Hypertension Dyslipidemia NAFLD  PLAN: 1.   Vincent Summers had 1 episode of left-sided chest discomfort which is described as squeezing and lasted about 5 to 10 minutes.  Subsequently it is not really recurred.  He had a CT coronary angiogram last year which showed minimal coronary disease.  He is lost some weight over the past year which is good about 7 or 8 pounds and his lipids in July were improved with total cholesterol 128, triglycerides 154, HDL 39 and LDL 62.  I would favor continue his current medications and monitoring for symptoms.  If he has recurrent chest pain symptoms we may need to consider further evaluation possible repeat cardiac catheterization versus antianginal therapy.  Follow-up with me annually or sooner with necessary.  Vincent Casino, MD, [facility], [place] Director of the [facility]  [facility] Diplomate of the American Board of Clinical Lipidology Attending Cardiologist  Direct Dial: [phone]   Fax: [phone]  Website:  [email or web address]  Tyniah Kastens 03/24/2021, 11:15 AM

## 2021-03-24 NOTE — Patient Instructions (Signed)

## 2021-04-02 ENCOUNTER — Other Ambulatory Visit: Payer: Self-pay | Admitting: Internal Medicine

## 2021-08-08 ENCOUNTER — Other Ambulatory Visit: Payer: Self-pay | Admitting: Internal Medicine

## 2021-08-26 ENCOUNTER — Encounter (HOSPITAL_COMMUNITY): Payer: Self-pay

## 2021-08-26 ENCOUNTER — Ambulatory Visit (HOSPITAL_COMMUNITY)
Admission: RE | Admit: 2021-08-26 | Discharge: 2021-08-26 | Disposition: A | Discharge status: Home / Self Care | Payer: 59 | Source: Ambulatory Visit | Attending: Physician Assistant | Admitting: Physician Assistant

## 2021-08-26 VITALS — BP 147/91 | HR 76 | Temp 98.3°F | Resp 18 | Ht 72.0 in

## 2021-08-26 DIAGNOSIS — H00014 Hordeolum externum left upper eyelid: Secondary | ICD-10-CM

## 2021-08-26 MED ORDER — ERYTHROMYCIN 5 MG/GM OP OINT: TOPICAL_OINTMENT | OPHTHALMIC | 0 refills | Status: DC

## 2021-08-26 NOTE — Discharge Instructions (Addendum)
I believe you have infected gland in your eyelid.  Please start erythromycin ointment twice daily.  Do not touch tip of bottle to your eye and make sure to wash your hands prior to handling medication to prevent contamination of medicine.  Use warm compress several times per day as we discussed.  You can use Tylenol and ibuprofen for pain.  If your symptoms or not improving please follow-up with ophthalmologist; call to schedule an appointment.  If you develop any visual change, eye pain, headache, nausea, vomiting you should be seen immediately.

## 2021-08-26 NOTE — ED Provider Notes (Signed)
[place]    CSN: [id number] Arrival date & time: 08/26/21  1631      History   Chief Complaint Chief Complaint  Patient presents with   Eye Problem    Entered by patient    HPI Vincent Summers is a 61 y.o. male.   Patient presents today with a 1 day history of left eyelid swelling and discomfort.  He denies any prior particulate matter exposure or exposure to chemicals.  He does wears readers but does not wear glasses or contacts on a regular basis.  He does report some irritation of the eye as well as sensitivity to light.  Denies any foreign body sensation.  Denies any recent illness or additional symptoms including cough, congestion, fever.  He has not tried any over-the-counter medication for symptom management.  He denies any visual disturbance, diplopia, headache, dizziness, nausea, vomiting.   Past Medical History:  Diagnosis Date   Anxiety    Arthritis    GERD (gastroesophageal reflux disease)    History of hiatal hernia    Hypercholesteremia    Hypertension    NAFL (nonalcoholic fatty liver)    Palpitations    Rosacea    Spinal stenosis at L4-L5 level     Patient Active Problem List   Diagnosis Date Noted   NAFLD (nonalcoholic fatty liver disease) 04/15/2014   Dyslipidemia 04/15/2014   Chest pain 02/28/2014   GERD (gastroesophageal reflux disease) 11/21/2013   Arthritis    Hypertension    Anxiety     Past Surgical History:  Procedure Laterality Date   APPENDECTOMY     CARDIAC CATHETERIZATION  2007   Dr. Haroldine Laws   INSERTION OF MESH N/A 11/28/2018   Procedure: Insertion Of Mesh;  Surgeon: Donnie Mesa, MD;  Location: [facility];  Service: General;  Laterality: N/A;   KNEE SURGERY     SHOULDER SURGERY     TONSILLECTOMY     VENTRAL HERNIA REPAIR N/A 11/28/2018   Procedure: LAPAROSCOPIC VENTRAL HERNIA REPAIR;  Surgeon: Donnie Mesa, MD;  Location: [facility];  Service: General;  Laterality: N/A;       Home Medications    Prior to Admission  medications   Medication Sig Start Date End Date Taking? Authorizing Provider  erythromycin ophthalmic ointment Place a 1/2 inch ribbon of ointment into the lower eyelid left eye twice daily for 7 days. 08/26/21  Yes Margeaux Swantek, Derry Skill, PA-C  acetaminophen (TYLENOL) 500 MG tablet Take 1,000 mg by mouth every 6 (six) hours as needed for moderate pain or headache.    [provider]  ALPRAZolam Duanne Moron) 0.5 MG tablet Take 0.5 mg by mouth at bedtime as needed for anxiety. TAKE 3 TIMES DAILY PRN    [provider]  aspirin EC 81 MG tablet Take 1 tablet (81 mg total) by mouth daily. Swallow whole. 04/15/20   Pixie Casino, MD  atorvastatin (LIPITOR) 20 MG tablet TAKE 1 TABLET BY MOUTH EVERY DAY 04/02/21   Hilty, Nadean Corwin, MD  benazepril (LOTENSIN) 40 MG tablet Take 40 mg by mouth daily at 12 noon.    [provider]  esomeprazole (NEXIUM) 40 MG capsule Take 40 mg by mouth daily at 12 noon.    [provider]  gabapentin (NEURONTIN) 300 MG capsule Take 300 mg by mouth 2 (two) times daily.    [provider]  hydrochlorothiazide (HYDRODIURIL) 25 MG tablet Take 1 tablet (25 mg total) by mouth daily. Patient taking differently: Take 25 mg  by mouth daily at 12 noon. 04/15/17   Susy Frizzle, MD  metoprolol succinate (TOPROL-XL) 25 MG 24 hr tablet TAKE 1 TABLET BY MOUTH EVERY DAY 08/10/21   Hilty, Nadean Corwin, MD  metoprolol tartrate (LOPRESSOR) 50 MG tablet Take 1 tablet by mouth once for procedure. 06/22/19   Hilty, Nadean Corwin, MD  minocycline (MINOCIN,DYNACIN) 100 MG capsule Take 1 capsule (100 mg total) by mouth 2 (two) times daily. 04/04/15   Susy Frizzle, MD  rizatriptan (MAXALT) 10 MG tablet TAKE 1 TABLET BY MOUTH ONCE AS NEEDED FOR MIGRAINE FOR UP TO 1 DOSE. MAY REPEAT IN 2 HOURS IF NEEDED 02/19/20   [provider]  sildenafil (REVATIO) 20 MG tablet Take 20-60 mg by mouth daily as needed (ED).    [provider]  traMADol (ULTRAM) 50 MG tablet  Take 50 mg by mouth every 6 (six) hours as needed.    [provider]  trimethoprim-polymyxin b (POLYTRIM) ophthalmic solution Place 1 drop into both eyes every 6 (six) hours. 04/29/19   Wieters, Elesa Hacker, PA-C    Family History Family History  Problem Relation Age of Onset   Emphysema Mother    Skin cancer Mother    Leukemia Father    Heart disease Father 78       CAD---CABG @ age 31   Arrhythmia Father    Arrhythmia Sister    Stroke Maternal Grandmother 62   Heart disease Maternal Grandfather    Stroke Maternal Grandfather 30   Hypertension Paternal Grandmother    Stroke Paternal Grandmother 85   Heart disease Paternal Grandfather 16    Social History Social History   Tobacco Use   Smoking status: Never   Smokeless tobacco: Never  Vaping Use   Vaping Use: Never used  Substance Use Topics   Alcohol use: Yes    Alcohol/week: 14.0 standard drinks    Types: 14 Cans of beer per week    Comment: Daily   Drug use: No     Allergies   Fenofibrate   Review of Systems Review of Systems  Constitutional:  Negative for activity change, appetite change, fatigue and fever.  Eyes:  Positive for photophobia and pain (Eyelid). Negative for discharge, redness, itching and visual disturbance.  Gastrointestinal:  Negative for abdominal pain, diarrhea, nausea and vomiting.  Neurological:  Negative for dizziness, light-headedness and headaches.    Physical Exam Triage Vital Signs ED Triage Vitals  Enc Vitals Group     BP 08/26/21 1652 (!) 147/91     Pulse Rate 08/26/21 1652 76     Resp 08/26/21 1652 18     Temp 08/26/21 1652 98.3 F (36.8 C)     Temp Source 08/26/21 1652 Oral     SpO2 08/26/21 1652 97 %     Weight --      Height 08/26/21 1650 6' (1.829 m)     Head Circumference --      Peak Flow --      Pain Score 08/26/21 1650 4     Pain Loc --      Pain Edu? --      Excl. in [place]? --    No data found.  Updated Vital Signs BP (!) 147/91 (BP Location: Right  Arm)   Pulse 76   Temp 98.3 F (36.8 C) (Oral)   Resp 18   Ht 6' (1.829 m)   SpO2 97%   BMI 32.39 kg/m   Visual Acuity Right Eye  Distance: 20/15 Left Eye Distance: 20/15 Bilateral Distance: 20/15  Right Eye Near:   Left Eye Near:    Bilateral Near:     Physical Exam Vitals reviewed.  Constitutional:      General: He is awake.     Appearance: Normal appearance. He is well-developed. He is not ill-appearing.     Comments: Very pleasant male appears stated age in no acute distress sitting comfortably in exam room  HENT:     Head: Normocephalic and atraumatic.  Eyes:     General:        Right eye: No hordeolum.        Left eye: Hordeolum present.    Extraocular Movements: Extraocular movements intact.     Conjunctiva/sclera:     Right eye: Right conjunctiva is not injected.     Left eye: Left conjunctiva is not injected.     Pupils: Pupils are equal, round, and reactive to light.      Comments: No pain with extraocular movements.  Cardiovascular:     Rate and Rhythm: Normal rate and regular rhythm.     Heart sounds: Normal heart sounds, S1 normal and S2 normal. No murmur heard. Pulmonary:     Effort: Pulmonary effort is normal.     Breath sounds: Normal breath sounds. No stridor. No wheezing, rhonchi or rales.     Comments: Clear to auscultation bilaterally Neurological:     Mental Status: He is alert.  Psychiatric:        Behavior: Behavior is cooperative.     UC Treatments / Results  Labs (all labs ordered are listed, but only abnormal results are displayed) Labs Reviewed - No data to display  EKG   Radiology No results found.  Procedures Procedures (including critical care time)  Medications Ordered in UC Medications - No data to display  Initial Impression / Assessment and Plan / UC Course  I have reviewed the triage vital signs and the nursing notes.  Pertinent labs & imaging results that were available during my care of the patient were  reviewed by me and considered in my medical decision making (see chart for details).     Hordeolum noted on exam.  Patient was started on erythromycin ointment twice daily for a minimum of 7 days.  Recommend warm compresses and over-the-counter medications for additional symptom relief.  Fluorescein staining was deferred as he denied any foreign body sensation or recent trauma.  Small hyperpigmentation noted in left iris but patient reports this has been present throughout his life.  Discussed that if symptoms or not improving quickly he should follow-up with ophthalmology and was given contact information for local provider.  Discussed that if he has any worsening symptoms including visual disturbance, diplopia, nausea, vomiting he needs to be seen immediately.  Final Clinical Impressions(s) / UC Diagnoses   Final diagnoses:  Hordeolum externum of left upper eyelid     Discharge Instructions      I believe you have infected gland in your eyelid.  Please start erythromycin ointment twice daily.  Do not touch tip of bottle to your eye and make sure to wash your hands prior to handling medication to prevent contamination of medicine.  Use warm compress several times per day as we discussed.  You can use Tylenol and ibuprofen for pain.  If your symptoms or not improving please follow-up with ophthalmologist; call to schedule an appointment.  If you develop any visual change, eye pain, headache, nausea, vomiting you should  be seen immediately.     ED Prescriptions     Medication Sig Dispense Auth. Provider   erythromycin ophthalmic ointment Place a 1/2 inch ribbon of ointment into the lower eyelid left eye twice daily for 7 days. 3.5 g Aziza Stuckert K, PA-C      PDMP not reviewed this encounter.   Terrilee Croak, PA-C 08/26/21 2052

## 2021-08-26 NOTE — ED Triage Notes (Signed)
Pt reports left eye redness, watery drainage, sensitivity to sunlight and itchiness since this morning.

## 2021-11-10 ENCOUNTER — Other Ambulatory Visit: Payer: Self-pay | Admitting: Internal Medicine

## 2021-11-10 NOTE — Telephone Encounter (Signed)
Rx(s) sent to pharmacy electronically.  

## 2021-11-12 ENCOUNTER — Other Ambulatory Visit: Payer: Self-pay

## 2021-11-12 ENCOUNTER — Emergency Department (HOSPITAL_COMMUNITY)
Admission: EM | Admit: 2021-11-12 | Discharge: 2021-11-13 | Disposition: A | Discharge status: Home / Self Care | Payer: 59 | Attending: Emergency Medicine | Admitting: Emergency Medicine

## 2021-11-12 ENCOUNTER — Encounter (HOSPITAL_COMMUNITY): Payer: Self-pay | Admitting: Emergency Medicine

## 2021-11-12 DIAGNOSIS — R110 Nausea: Secondary | ICD-10-CM | POA: Diagnosis not present

## 2021-11-12 DIAGNOSIS — R0789 Other chest pain: Secondary | ICD-10-CM | POA: Insufficient documentation

## 2021-11-12 DIAGNOSIS — Z5321 Procedure and treatment not carried out due to patient leaving prior to being seen by health care provider: Secondary | ICD-10-CM | POA: Insufficient documentation

## 2021-11-12 DIAGNOSIS — R202 Paresthesia of skin: Secondary | ICD-10-CM | POA: Diagnosis not present

## 2021-11-12 DIAGNOSIS — R002 Palpitations: Secondary | ICD-10-CM | POA: Diagnosis not present

## 2021-11-12 NOTE — ED Triage Notes (Signed)
Pt reported to ED with c/o generalized chest tightness and palpitation since 8am. States he been experiencing nausea and symptoms have occurred intermittently. 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

## 2021-11-13 ENCOUNTER — Emergency Department (HOSPITAL_COMMUNITY): Payer: 59

## 2021-11-13 LAB — CBC
HCT: 42.9 % (ref 39.0–52.0)
Hemoglobin: 14.9 g/dL (ref 13.0–17.0)
MCH: 31.7 pg (ref 26.0–34.0)
MCHC: 34.7 g/dL (ref 30.0–36.0)
MCV: 91.3 fL (ref 80.0–100.0)
Platelets: 247 10*3/uL (ref 150–400)
RBC: 4.7 MIL/uL (ref 4.22–5.81)
RDW: 12.7 % (ref 11.5–15.5)
WBC: 7.8 10*3/uL (ref 4.0–10.5)
nRBC: 0 % (ref 0.0–0.2)

## 2021-11-13 LAB — BASIC METABOLIC PANEL
Anion gap: 9 (ref 5–15)
BUN: 13 mg/dL (ref 6–20)
CO2: 27 mmol/L (ref 22–32)
Calcium: 9.8 mg/dL (ref 8.9–10.3)
Chloride: 104 mmol/L (ref 98–111)
Creatinine, Ser: 0.96 mg/dL (ref 0.61–1.24)
GFR, Estimated: >60 mL/min (ref >60)
Glucose, Bld: 110 mg/dL — ABNORMAL HIGH (ref 70–99)
Potassium: 3.7 mmol/L (ref 3.5–5.1)
Sodium: 140 mmol/L (ref 135–145)

## 2021-11-13 LAB — TROPONIN I (HIGH SENSITIVITY)
Troponin I (High Sensitivity): 9 ng/L (ref <18)
Troponin I (High Sensitivity): 9 ng/L (ref <18)

## 2021-11-13 NOTE — ED Notes (Signed)
Pt left. Stated he will follow up elsewhere in the morning

## 2021-11-15 ENCOUNTER — Encounter (HOSPITAL_COMMUNITY): Payer: Self-pay

## 2021-11-15 ENCOUNTER — Encounter (HOSPITAL_BASED_OUTPATIENT_CLINIC_OR_DEPARTMENT_OTHER): Payer: Self-pay | Admitting: Emergency Medicine

## 2021-11-15 ENCOUNTER — Other Ambulatory Visit: Payer: Self-pay

## 2021-11-15 ENCOUNTER — Ambulatory Visit (HOSPITAL_COMMUNITY)
Admission: EM | Admit: 2021-11-15 | Discharge: 2021-11-15 | Disposition: A | Discharge status: Home / Self Care | Payer: 59 | Attending: Family Medicine | Admitting: Family Medicine

## 2021-11-15 ENCOUNTER — Emergency Department (HOSPITAL_BASED_OUTPATIENT_CLINIC_OR_DEPARTMENT_OTHER)
Admission: EM | Admit: 2021-11-15 | Discharge: 2021-11-15 | Disposition: A | Discharge status: Home / Self Care | Payer: 59 | Attending: Emergency Medicine | Admitting: Emergency Medicine

## 2021-11-15 ENCOUNTER — Emergency Department (HOSPITAL_BASED_OUTPATIENT_CLINIC_OR_DEPARTMENT_OTHER): Payer: 59 | Admitting: Radiology

## 2021-11-15 DIAGNOSIS — I10 Essential (primary) hypertension: Secondary | ICD-10-CM | POA: Diagnosis not present

## 2021-11-15 DIAGNOSIS — R072 Precordial pain: Secondary | ICD-10-CM | POA: Diagnosis not present

## 2021-11-15 DIAGNOSIS — Z79899 Other long term (current) drug therapy: Secondary | ICD-10-CM | POA: Diagnosis not present

## 2021-11-15 DIAGNOSIS — I480 Paroxysmal atrial fibrillation: Secondary | ICD-10-CM

## 2021-11-15 DIAGNOSIS — Z7901 Long term (current) use of anticoagulants: Secondary | ICD-10-CM | POA: Insufficient documentation

## 2021-11-15 DIAGNOSIS — I4891 Unspecified atrial fibrillation: Secondary | ICD-10-CM | POA: Diagnosis not present

## 2021-11-15 DIAGNOSIS — R0789 Other chest pain: Secondary | ICD-10-CM | POA: Diagnosis present

## 2021-11-15 LAB — CBC
HCT: 42.7 % (ref 39.0–52.0)
Hemoglobin: 14.9 g/dL (ref 13.0–17.0)
MCH: 31.2 pg (ref 26.0–34.0)
MCHC: 34.9 g/dL (ref 30.0–36.0)
MCV: 89.5 fL (ref 80.0–100.0)
Platelets: 239 10*3/uL (ref 150–400)
RBC: 4.77 MIL/uL (ref 4.22–5.81)
RDW: 12.4 % (ref 11.5–15.5)
WBC: 6.8 10*3/uL (ref 4.0–10.5)
nRBC: 0 % (ref 0.0–0.2)

## 2021-11-15 LAB — BASIC METABOLIC PANEL
Anion gap: 9 (ref 5–15)
BUN: 20 mg/dL (ref 6–20)
CO2: 28 mmol/L (ref 22–32)
Calcium: 9.5 mg/dL (ref 8.9–10.3)
Chloride: 99 mmol/L (ref 98–111)
Creatinine, Ser: 0.95 mg/dL (ref 0.61–1.24)
GFR, Estimated: >60 mL/min (ref >60)
Glucose, Bld: 84 mg/dL (ref 70–99)
Potassium: 3.6 mmol/L (ref 3.5–5.1)
Sodium: 136 mmol/L (ref 135–145)

## 2021-11-15 LAB — TROPONIN I (HIGH SENSITIVITY): Troponin I (High Sensitivity): 7 ng/L (ref <18)

## 2021-11-15 MED ORDER — METOPROLOL TARTRATE 25 MG PO TABS: 25.0000 mg | ORAL_TABLET | Freq: Two times a day (BID) | ORAL | 0 refills | Status: DC | PRN

## 2021-11-15 MED ORDER — APIXABAN 5 MG PO TABS: 5.0000 mg | ORAL_TABLET | Freq: Two times a day (BID) | ORAL | 2 refills | Status: DC

## 2021-11-15 NOTE — ED Triage Notes (Signed)
Pt reports urgent care for SOB x 2-3 days. He reports history of A-FIB. Pt was seen in the ED on 11/12/2021 but left due to wait time.

## 2021-11-15 NOTE — Discharge Instructions (Signed)
Please go to the emergency room.

## 2021-11-15 NOTE — ED Provider Notes (Signed)
Patient seen in triage.  He has had 3 days of palpitations, chest tightness, and shortness of breath.  He went to the emergency room and eloped after there was a long wait.  EKG there confirms A-fib.  Today his blood pressure is good at 136/88 heart rate is 100 on the O2 sat monitor.  I counted a heart rate of 90 apically.  Temp is normal O2 sat is normal respiratory rate is 22  I have asked him to proceed to one of our emergency room's for further evaluation   Barrett Henle, MD 11/15/21 1400

## 2021-11-15 NOTE — ED Provider Notes (Signed)
[place] EMERGENCY DEPT Provider Note   CSN: [id number] Arrival date & time: 11/15/21  1427     History  Chief Complaint  Patient presents with   Chest Pain    Vincent Summers is a 61 y.o. male.   Thursday at 8 am woke up with palpitations as well as chest tightness. Was evaluated at the hospital for his symptoms.     afib. History of vtach.    Cards appt in jan 2023 and follow up in 12 months. None scheduled yet.  Vomiting due to feeling his palpitations and then threw up.   History or cardiac cath, no stents. History of HTN.   Taken metoprolol, HCTZ.    The history is provided by the patient. No language interpreter was used.  Chest Pain Associated symptoms: nausea and vomiting   Associated symptoms: no shortness of breath        Home Medications Prior to Admission medications   Medication Sig Start Date End Date Taking? Authorizing Provider  acetaminophen (TYLENOL) 500 MG tablet Take 1,000 mg by mouth every 6 (six) hours as needed for moderate pain or headache.    [provider]  ALPRAZolam Duanne Moron) 0.5 MG tablet Take 0.5 mg by mouth at bedtime as needed for anxiety. TAKE 3 TIMES DAILY PRN    [provider]  aspirin EC 81 MG tablet Take 1 tablet (81 mg total) by mouth daily. Swallow whole. 04/15/20   Pixie Casino, MD  atorvastatin (LIPITOR) 20 MG tablet TAKE 1 TABLET BY MOUTH EVERY DAY 11/10/21   Hilty, Nadean Corwin, MD  benazepril (LOTENSIN) 40 MG tablet Take 40 mg by mouth daily at 12 noon.    [provider]  erythromycin ophthalmic ointment Place a 1/2 inch ribbon of ointment into the lower eyelid left eye twice daily for 7 days. 08/26/21   Raspet, Derry Skill, PA-C  esomeprazole (NEXIUM) 40 MG capsule Take 40 mg by mouth daily at 12 noon.    [provider]  gabapentin (NEURONTIN) 300 MG capsule Take 300 mg by mouth 2 (two) times daily.    [provider]  hydrochlorothiazide (HYDRODIURIL) 25 MG tablet  Take 1 tablet (25 mg total) by mouth daily. Patient taking differently: Take 25 mg by mouth daily at 12 noon. 04/15/17   Susy Frizzle, MD  metoprolol succinate (TOPROL-XL) 25 MG 24 hr tablet TAKE 1 TABLET BY MOUTH EVERY DAY 08/10/21   Hilty, Nadean Corwin, MD  metoprolol tartrate (LOPRESSOR) 50 MG tablet Take 1 tablet by mouth once for procedure. 06/22/19   Hilty, Nadean Corwin, MD  minocycline (MINOCIN,DYNACIN) 100 MG capsule Take 1 capsule (100 mg total) by mouth 2 (two) times daily. 04/04/15   Susy Frizzle, MD  rizatriptan (MAXALT) 10 MG tablet TAKE 1 TABLET BY MOUTH ONCE AS NEEDED FOR MIGRAINE FOR UP TO 1 DOSE. MAY REPEAT IN 2 HOURS IF NEEDED 02/19/20   [provider]  sildenafil (REVATIO) 20 MG tablet Take 20-60 mg by mouth daily as needed (ED).    [provider]  traMADol (ULTRAM) 50 MG tablet Take 50 mg by mouth every 6 (six) hours as needed.    [provider]  trimethoprim-polymyxin b (POLYTRIM) ophthalmic solution Place 1 drop into both eyes every 6 (six) hours. 04/29/19   Wieters, Hallie C, PA-C      Allergies    Fenofibrate    Review of Systems   Review of Systems  Respiratory:  Positive for chest tightness.  Negative for shortness of breath.   Cardiovascular:  Positive for chest pain.  Gastrointestinal:  Positive for nausea and vomiting.  Neurological:  Positive for light-headedness. Negative for syncope.  All other systems reviewed and are negative.   Physical Exam Updated Vital Signs BP 127/75   Pulse 70   Temp 98.1 F (36.7 C)   Resp (!) 24   SpO2 99%  Physical Exam  ED Results / Procedures / Treatments   Labs (all labs ordered are listed, but only abnormal results are displayed) Labs Reviewed  BASIC METABOLIC PANEL  CBC  TROPONIN I (HIGH SENSITIVITY)    EKG EKG Interpretation  Date/Time:  Sunday November 15 2021 14:33:35 EDT Ventricular Rate:  81 PR Interval:  150 QRS Duration: 94 QT Interval:  360 QTC Calculation: 418 R  Axis:   35 Text Interpretation: Normal sinus rhythm Normal ECG When compared with ECG of 12-Nov-2021 23:46, Sinus rhythm has replaced Atrial fibrillation Confirmed by Octaviano Glow [id number]) on 11/15/2021 4:25:26 PM  Radiology DG Chest 2 View  Result Date: 11/15/2021 CLINICAL DATA:  Left-sided chest pain and shortness of breath. Atrial fibrillation. EXAM: CHEST - 2 VIEW COMPARISON:  11/13/2021 FINDINGS: The heart size and mediastinal contours are within normal limits. Both lungs are clear. The visualized skeletal structures are unremarkable. IMPRESSION: No active cardiopulmonary disease. Electronically Signed   By: Marlaine Hind M.D.   On: 11/15/2021 16:29    Procedures Procedures  {Document cardiac monitor, telemetry assessment procedure when appropriate:1}  Medications Ordered in ED Medications - No data to display  ED Course/ Medical Decision Making/ A&P                           Medical Decision Making Amount and/or Complexity of Data Reviewed Labs: ordered. Radiology: ordered.   ***  {Document critical care time when appropriate:1} {Document review of labs and clinical decision tools ie heart score, Chads2Vasc2 etc:1}  {Document your independent review of radiology images, and any outside records:1} {Document your discussion with family members, caretakers, and with consultants:1} {Document social determinants of health affecting pt's care:1} {Document your decision making why or why not admission, treatments were needed:1} Final Clinical Impression(s) / ED Diagnoses Final diagnoses:  None    Rx / DC Orders ED Discharge Orders     None

## 2021-11-15 NOTE — ED Triage Notes (Signed)
Pt went to er on Thursday for chest tightness/palp, was told he was in afib , wait was too long and he left. Pt returns today for same symptoms, was hoping to see his cardiologist this week.

## 2021-11-15 NOTE — Discharge Instructions (Addendum)
For your A-fib we started you on Eliquis which is a blood thinner.  You will stop taking aspirin.  You will continue taking the metoprolol SUCCINATE (Toprol-XL) daily.  If you experience palpitations again, I prescribed you a short acting metoprolol medicine called metoprolol TARTRATE (Lopressor), 25 mg.  You can take this medicine up to twice a day as needed for active palpitations.  If you begin to feel lightheaded, short of breath, chest pain or pressure, or feel like passing out, call 911 and come back to the ER.  Please avoid caffeine, alcohol, smoking, any recreational drugs, or high heat or heat exhaustion.  These are all triggers for atrial fibrillation.  Please carefully review the packet that is provided at the pharmacy with Eliquis.  Pay attention to signs of internal bleeding.  This would include dark or bloody stools, lightheadedness, skin pallor, or shortness of breath.  You should avoid any contact sports or high risk activities while you are on blood thinners.  Finally, call your cardiology office tomorrow and ask for follow-up appointment.  Ultimately your management and your medications will be determined by your cardiologist.

## 2021-11-16 ENCOUNTER — Telehealth: Payer: Self-pay | Admitting: Internal Medicine

## 2021-11-16 NOTE — Telephone Encounter (Signed)
Returned call to patient, patient states he was seen in ED 8/27 and Eliquis was started for afib.  He was unable to get this filled as it needs a PA.     Advised will work on [place].

## 2021-11-16 NOTE — Progress Notes (Unsigned)
Cardiology Clinic Note   Patient Name: Vincent Summers Date of Encounter: 11/18/2021  Primary Care Provider:  Chesley Noon, MD Primary Cardiologist:  Pixie Casino, MD  Patient Profile    Vincent Summers 61 year old male presents the clinic today for follow-up evaluation of his hypertension, hyperlipidemia, and chest pain.  Past Medical History    Past Medical History:  Diagnosis Date   Anxiety    Arthritis    GERD (gastroesophageal reflux disease)    History of hiatal hernia    Hypercholesteremia    Hypertension    NAFL (nonalcoholic fatty liver)    Palpitations    Rosacea    Spinal stenosis at L4-L5 level    Past Surgical History:  Procedure Laterality Date   APPENDECTOMY     CARDIAC CATHETERIZATION  2007   Dr. Haroldine Laws   INSERTION OF MESH N/A 11/28/2018   Procedure: Insertion Of Mesh;  Surgeon: Donnie Mesa, MD;  Location: [facility];  Service: General;  Laterality: N/A;   KNEE SURGERY     SHOULDER SURGERY     TONSILLECTOMY     VENTRAL HERNIA REPAIR N/A 11/28/2018   Procedure: LAPAROSCOPIC VENTRAL HERNIA REPAIR;  Surgeon: Donnie Mesa, MD;  Location: [facility];  Service: General;  Laterality: N/A;    Allergies  Allergies  Allergen Reactions   Fenofibrate Other (See Comments)    Back pain Back pain Back pain Back pain     History of Present Illness    Vincent Summers has a PMH of paroxysmal atrial fibrillation, palpitations, chest pain and hyperlipidemia.  He was seen in follow-up by Dr. Debara Pickett on 03/24/2021.  During that time he was doing well.  He reported an episode of left-sided chest discomfort while at work.  He described the sensation as dull and nonradiating.  It would last for about 5 minutes - 10 minutes during breaks at work.  He underwent coronary angiogram April 2022 which showed minimal coronary disease.  He denied increased chest discomfort or shortness of breath with exertion.  He remained physically active at work and was walking 7-9 miles per  day.  He presented to the emergency department 11/15/2021.  He reported concerns for chest discomfort and palpitations that started 4 days prior.  He noted chest tightness.  He reported emesis.  His EKG showed normal sinus rhythm 11/12/2021.  His EKG 11/15/2021 showed atrial fibrillation.  CHA2DS2-VASc score 2.  It was felt that he was having an episode of paroxysmal atrial fibrillation.  He was started on apixaban.  He was also given metoprolol 25 mg as needed for palpitations.  He was instructed to avoid caffeine and alcohol.  Increased hydration was discussed.  He was instructed to follow-up with cardiology.  He presents to the clinic today for follow-up evaluation and states he feels well today.  He reports that he had several episodes of irregular heartbeat at the end of last week and into the weekend.  He reports that initially he noticed irregular heartbeat for 2 days and presented to the emergency department.  He was diagnosed with precordial pain and then return to the emergency department later and was noted to be in atrial fibrillation.  He was started on apixaban and metoprolol.  We reviewed his CHA2DS2-VASc score.  He has had no further episodes of sustained irregular heart rate.  We reviewed bleeding precautions and triggers for atrial fibrillation.  He expressed understanding.  We will plan follow-up in 3 to 4 months.  Today he denies chest pain, shortness of breath, lower extremity edema, fatigue, palpitations, melena, hematuria, hemoptysis, diaphoresis, weakness, presyncope, syncope, orthopnea, and PND.    Home Medications    Prior to Admission medications   Medication Sig Start Date End Date Taking? Authorizing Provider  acetaminophen (TYLENOL) 500 MG tablet Take 1,000 mg by mouth every 6 (six) hours as needed for moderate pain or headache.    [provider]  ALPRAZolam Duanne Moron) 0.5 MG tablet Take 0.5 mg by mouth at bedtime as needed for anxiety. TAKE 3 TIMES DAILY PRN     [provider]  apixaban (ELIQUIS) 5 MG TABS tablet Take 1 tablet (5 mg total) by mouth 2 (two) times daily. 11/15/21 12/15/21  Wyvonnia Dusky, MD  atorvastatin (LIPITOR) 20 MG tablet TAKE 1 TABLET BY MOUTH EVERY DAY 11/10/21   Hilty, Nadean Corwin, MD  benazepril (LOTENSIN) 40 MG tablet Take 40 mg by mouth daily at 12 noon.    [provider]  erythromycin ophthalmic ointment Place a 1/2 inch ribbon of ointment into the lower eyelid left eye twice daily for 7 days. 08/26/21   Raspet, Derry Skill, PA-C  esomeprazole (NEXIUM) 40 MG capsule Take 40 mg by mouth daily at 12 noon.    [provider]  gabapentin (NEURONTIN) 300 MG capsule Take 300 mg by mouth 2 (two) times daily.    [provider]  hydrochlorothiazide (HYDRODIURIL) 25 MG tablet Take 1 tablet (25 mg total) by mouth daily. Patient taking differently: Take 25 mg by mouth daily at 12 noon. 04/15/17   Susy Frizzle, MD  metoprolol succinate (TOPROL-XL) 25 MG 24 hr tablet TAKE 1 TABLET BY MOUTH EVERY DAY 08/10/21   Hilty, Nadean Corwin, MD  metoprolol tartrate (LOPRESSOR) 25 MG tablet Take 1 tablet (25 mg total) by mouth 2 (two) times daily as needed for up to 30 doses (For active palpitations/ A Fib). 11/15/21   Wyvonnia Dusky, MD  metoprolol tartrate (LOPRESSOR) 50 MG tablet Take 1 tablet by mouth once for procedure. 06/22/19   Hilty, Nadean Corwin, MD  minocycline (MINOCIN,DYNACIN) 100 MG capsule Take 1 capsule (100 mg total) by mouth 2 (two) times daily. 04/04/15   Susy Frizzle, MD  rizatriptan (MAXALT) 10 MG tablet TAKE 1 TABLET BY MOUTH ONCE AS NEEDED FOR MIGRAINE FOR UP TO 1 DOSE. MAY REPEAT IN 2 HOURS IF NEEDED 02/19/20   [provider]  sildenafil (REVATIO) 20 MG tablet Take 20-60 mg by mouth daily as needed (ED).    [provider]  traMADol (ULTRAM) 50 MG tablet Take 50 mg by mouth every 6 (six) hours as needed.    [provider]  trimethoprim-polymyxin b (POLYTRIM) ophthalmic  solution Place 1 drop into both eyes every 6 (six) hours. 04/29/19   Wieters, Elesa Hacker, PA-C    Family History    Family History  Problem Relation Age of Onset   Emphysema Mother    Skin cancer Mother    Leukemia Father    Heart disease Father 36       CAD---CABG @ age 81   Arrhythmia Father    Arrhythmia Sister    Stroke Maternal Grandmother 68   Heart disease Maternal Grandfather    Stroke Maternal Grandfather 74   Hypertension Paternal Grandmother    Stroke Paternal Grandmother 85   Heart disease Paternal Grandfather 52   He indicated that his mother is alive. He indicated that his father is deceased. He indicated that  both of his sisters are alive. He indicated that his brother is alive. He indicated that the status of his maternal grandmother is unknown. He indicated that the status of his maternal grandfather is unknown. He indicated that the status of his paternal grandmother is unknown. He indicated that the status of his paternal grandfather is unknown.  Social History    Social History   Socioeconomic History   Marital status: Married    Spouse name: Not on file   Number of children: Not on file   Years of education: Not on file   Highest education level: Not on file  Occupational History   Not on file  Tobacco Use   Smoking status: Never   Smokeless tobacco: Never  Vaping Use   Vaping Use: Never used  Substance and Sexual Activity   Alcohol use: Yes    Alcohol/week: 14.0 standard drinks of alcohol    Types: 14 Cans of beer per week    Comment: Daily   Drug use: No   Sexual activity: Yes  Other Topics Concern   Not on file  Social History Narrative   Entered 11/2013:      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   Does not smoke.   Drinks 2 beers each evening. Drinks about the same amount on the weekends. No wine or liquor.      Married with 4 children-- all of them are in the house.   He has a 27 year old son he is  going [place] and then should be moving out of the house soon.   Has an 60 year old and he has 60 year old twins.   Social Determinants of Health   Financial Resource Strain: Not on file  Food Insecurity: Not on file  Transportation Needs: Not on file  Physical Activity: Not on file  Stress: Not on file  Social Connections: Not on file  Intimate Partner Violence: Not on file     Review of Systems    General:  No chills, fever, night sweats or weight changes.  Cardiovascular:  No chest pain, dyspnea on exertion, edema, orthopnea, palpitations, paroxysmal nocturnal dyspnea. Dermatological: No rash, lesions/masses Respiratory: No cough, dyspnea Urologic: No hematuria, dysuria Abdominal:   No nausea, vomiting, diarrhea, bright red blood per rectum, melena, or hematemesis Neurologic:  No visual changes, wkns, changes in mental status. All other systems reviewed and are otherwise negative except as noted above.  Physical Exam    VS:  BP 122/82   Pulse 68   Ht '6\' 2"'$  (1.88 m)   Wt 219 lb (99.3 kg)   BMI 28.12 kg/m  , BMI Body mass index is 28.12 kg/m. GEN: Well nourished, well developed, in no acute distress. HEENT: normal. Neck: Supple, no JVD, carotid bruits, or masses. Cardiac: RRR, no murmurs, rubs, or gallops. No clubbing, cyanosis, edema.  Radials/DP/PT 2+ and equal bilaterally.  Respiratory:  Respirations regular and unlabored, clear to auscultation bilaterally. GI: Soft, nontender, nondistended, BS + x 4. MS: no deformity or atrophy. Skin: warm and dry, no rash. Neuro:  Strength and sensation are intact. Psych: Normal affect.  Accessory Clinical Findings    Recent Labs: 11/15/2021: BUN 20; Creatinine, Ser 0.95; Hemoglobin 14.9; Platelets 239; Potassium 3.6; Sodium 136   Recent Lipid Panel    Component Value Date/Time   CHOL 181 02/12/2015 1300   TRIG 416 (H) 02/12/2015 1300   HDL 33 (L) 02/12/2015 1300   CHOLHDL 5.5 (H) 02/12/2015 1300  VLDL NOT CALC 02/12/2015  1300   LDLCALC NOT CALC 02/12/2015 1300         ECG personally reviewed by me today-normal sinus rhythm no ectopy 68 bpm- No acute changes  Coronary CTA 07/16/2019   FINDINGS: Non-cardiac: See separate report from [facility]. No significant findings on limited lung and soft tissue windows.   Calcium Score: Calcium noted in LM/LAD and diagonal system   Coronary Arteries: Right dominant with no anomalies   LM: 1-24% calcific ostial stenosis   LAD: 1-24% proximal and mid calcific stenosis   D1: 1-24% calcific stenosis   D2: 1-24% calcific stenosis   Circumflex: Normal   OM1: Normal   OM2: Normal   RCA: Normal   PDA: Normal   PLA: Normal   IMPRESSION: 1. Calcium score 49.4 which is 45 th percentile for age and sex   2.  Normal aortic root diameter 3.5 cm   3.  CAD RADS 1 non obstructive CAD see description above   Jenkins Rouge   Electronically Signed: By: Jenkins Rouge M.D. On: 07/16/2019 13:57  Echocardiogram 5/07 LEFT VENTRICLE:   -  Left ventricular size was normal.   -  Overall left ventricular systolic function was normal.   -  Left ventricular ejection fraction was estimated , range being 60         % to 65 %.   -  There was no diagnostic evidence of left ventricular regional         wall motion abnormalities.   -  Left ventricular wall thickness was mildly increased.    Doppler interpretation(s):   -  There was a normal transmitral flow pattern.   AORTIC VALVE:   -  The aortic valve was trileaflet.   -  There was normal aortic valve leaflet excursion.    Doppler interpretation(s):   -  Transaortic velocity was within the normal range.   -  There was no significant aortic valvular regurgitation.   AORTA:   -  There was mild aortic root dilatation (43 mm). Ascending aorta         measures 3.1 mm.   MITRAL VALVE:   -  Mitral valve structure was normal.    Doppler interpretation(s):   -  There was trivial mitral valvular  regurgitation.   LEFT ATRIUM:   -  The left atrium was mildly dilated.   RIGHT VENTRICLE:   -  Right ventricular size was normal.   -  Right ventricular systolic function was normal.   TRICUSPID VALVE:   -  The tricuspid valve structure was normal.    Doppler interpretation(s):   -  There was trivial tricuspid valvular regurgitation.   RIGHT ATRIUM:   -  Right atrial size was normal.   PERICARDIUM:   -  There was no pericardial effusion.    ---------------------------------------------------------------   SUMMARY   -  Overall left ventricular systolic function was normal. Left         ventricular ejection fraction was estimated , range being 60         % to 65 %. There was no diagnostic evidence of left         ventricular regional wall motion abnormalities. Left         ventricular wall thickness was mildly increased.   -  There was mild aortic root dilatation (43 mm). Ascending aorta         measures 3.1 mm.   -  The left atrium was mildly dilated.   -  There was no pericardial effusion.    Assessment & Plan   1.  Paroxysmal atrial fibrillation-EKG today shows normal sinus rhythm 68 bpm.  No ectopy..  Reports compliance with apixaban and denies bleeding issues.  CHA2DS2-VASc score 2. Continue metoprolol, apixaban May take an extra half dose of metoprolol for sustained palpitations. Heart healthy low-sodium diet-salty 6 given Increase physical activity as tolerated Avoid triggers caffeine, chocolate, EtOH, dehydration etc.  Chest discomfort-no chest pain today.  Had a coronary CTA 07/16/2019 which showed mild nonobstructive CAD. Continue metoprolol Heart healthy low-sodium diet-salty 6 given Increase physical activity as tolerated  Hyperlipidemia-reports compliance with statin therapy. Continue atorvastatin Heart healthy low-sodium diet-salty 6 given Increase physical activity as tolerated Follows with PCP  Disposition: Follow-up with Dr. Debara Pickett in 3-4 months.   Jossie Ng. Rashawn Rayman NP-C     11/18/2021, 9:32 AM [address] Office [phone] Fax [phone]  Notice: This dictation was prepared with Dragon dictation along with smaller phrase technology. Any transcriptional errors that result from this process are unintentional and may not be corrected upon review.  I spent 14 minutes examining this patient, reviewing medications, and using patient centered shared decision making involving her cardiac care.  Prior to her visit I spent greater than 20 minutes reviewing her past medical history,  medications, and prior cardiac tests.

## 2021-11-16 NOTE — Telephone Encounter (Signed)
Samples placed at front desk for pick up.   Patient aware.

## 2021-11-16 NOTE — Telephone Encounter (Signed)
**Note De-Identified Daleyza Gadomski Obfuscation** The pts ED discharge notes are incomplete and they are needed to complete this Eliquis PA.

## 2021-11-16 NOTE — Telephone Encounter (Signed)
Pt c/o medication issue:  1. Name of Medication: apixaban (ELIQUIS) 5 MG TABS tablet  2. How are you currently taking this medication (dosage and times per day)? Not currently taking  3. Are you having a reaction (difficulty breathing--STAT)? No   4. What is your medication issue? Patient was prescribed this medication while hospitalized, but is unable to pick it up from the pharmacy due to it being on hold. He states he contacted the hospital and they advised they would send in an alternative that the pharmacy never received. Please advise.

## 2021-11-17 MED ORDER — RIVAROXABAN 20 MG PO TABS: 20.0000 mg | ORAL_TABLET | Freq: Every day | ORAL | 0 refills | Status: DC

## 2021-11-17 NOTE — Telephone Encounter (Signed)
I was made aware that the patient could not pick up eliquis due to insurance pre-authorization difficulty.  I was not aware PA was required for starter packs.  I have prescribed xarelto as an alternative to his pharmacy.

## 2021-11-18 ENCOUNTER — Ambulatory Visit: Payer: 59 | Attending: Internal Medicine | Admitting: General Practice

## 2021-11-18 ENCOUNTER — Ambulatory Visit: Payer: 59 | Admitting: Internal Medicine

## 2021-11-18 ENCOUNTER — Encounter: Payer: Self-pay | Admitting: General Practice

## 2021-11-18 VITALS — BP 122/82 | HR 68 | Ht 74.0 in | Wt 219.0 lb

## 2021-11-18 DIAGNOSIS — I480 Paroxysmal atrial fibrillation: Secondary | ICD-10-CM | POA: Diagnosis not present

## 2021-11-18 DIAGNOSIS — E782 Mixed hyperlipidemia: Secondary | ICD-10-CM | POA: Diagnosis not present

## 2021-11-18 DIAGNOSIS — R079 Chest pain, unspecified: Secondary | ICD-10-CM

## 2021-11-18 NOTE — Patient Instructions (Signed)
Medication Instructions:  The current medical regimen is effective;  continue present plan and medications as directed. Please refer to the Current Medication list given to you today.   *If you need a refill on your cardiac medications before your next appointment, please call your pharmacy*  Lab Work:   Testing/Procedures:  NONE    NONE  If you have labs (blood work) drawn today and your tests are completely normal, you will receive your results only by:  1-MyChart Message (if you have MyChart) OR 2- A paper copy in the mail.  If you have any lab test that is abnormal or we need to change your treatment, we will call you to review the results.  Special Instructions PLEASE INCREASE PHYSICAL ACTIVITY AS TOLERATED   Please try to avoid these triggers for your AFIB: Fatigue and Sleep Deprivation, drinking alcohol, particularly binge drinking. being overweight (read about how to lose weight) drinking lots of caffeine, such as tea, coffee or energy drinks. taking illegal drugs, particularly amphetamines or cocaine.  YOU MAY RETURN TO WORK TOMORROW 11-19-2021  Follow-Up: Your next appointment:  3-4 month(s) In Person with Pixie Casino, MD    :1  At [facility], you and your health needs are our priority.  As part of our continuing mission to provide you with exceptional heart care, we have created designated Provider Care Teams.  These Care Teams include your primary Cardiologist (physician) and Advanced Practice Providers (APPs -  Physician Assistants and Nurse Practitioners) who all work together to provide you with the care you need, when you need it.  Important Information About Sugar

## 2021-11-18 NOTE — Telephone Encounter (Signed)
**Note De-Identified Lanetta Figuero Obfuscation** Dewaine Conger Key: [id number] - PA Case ID: [id number] Outcome: This request has been approved using information available on the patient's profile. Prior Auth;Coverage Start Date:10/19/2021;Coverage End Date:11/18/2022; Drug Eliquis '5MG'$  tablets Form [employer] Electronic PA Form (2017 NCPDP)  Per prior phone note in this message, the pts Eliquis was replaced with Xarelto.

## 2021-12-29 ENCOUNTER — Telehealth: Payer: 59 | Admitting: Family Medicine

## 2021-12-29 DIAGNOSIS — J069 Acute upper respiratory infection, unspecified: Secondary | ICD-10-CM | POA: Diagnosis not present

## 2021-12-29 MED ORDER — FLUTICASONE PROPIONATE 50 MCG/ACT NA SUSP: 2.0000 | Freq: Every day | NASAL | 0 refills | Status: DC

## 2021-12-29 MED ORDER — PROMETHAZINE-DM 6.25-15 MG/5ML PO SYRP: 5.0000 mL | ORAL_SOLUTION | Freq: Four times a day (QID) | ORAL | 0 refills | Status: DC | PRN

## 2021-12-29 NOTE — Patient Instructions (Addendum)
Vincent Summers, thank you for joining Perlie Mayo, NP for today's virtual visit.  While this provider is not your primary care provider (PCP), if your PCP is located in our provider database this encounter information will be shared with them immediately following your visit.  Consent: (Patient) Vincent Summers provided verbal consent for this virtual visit at the beginning of the encounter.  Current Medications:  Current Outpatient Medications:    fluticasone (FLONASE) 50 MCG/ACT nasal spray, Place 2 sprays into both nostrils daily., Disp: 16 g, Rfl: 0   promethazine-dextromethorphan (PROMETHAZINE-DM) 6.25-15 MG/5ML syrup, Take 5 mLs by mouth 4 (four) times daily as needed for cough., Disp: 118 mL, Rfl: 0   acetaminophen (TYLENOL) 500 MG tablet, Take 1,000 mg by mouth every 6 (six) hours as needed for moderate pain or headache., Disp: , Rfl:    ALPRAZolam (XANAX) 0.5 MG tablet, Take 0.5 mg by mouth at bedtime as needed for anxiety. TAKE 3 TIMES DAILY PRN, Disp: , Rfl:    apixaban (ELIQUIS) 5 MG TABS tablet, Take 1 tablet (5 mg total) by mouth 2 (two) times daily., Disp: 60 tablet, Rfl: 2   atorvastatin (LIPITOR) 20 MG tablet, TAKE 1 TABLET BY MOUTH EVERY DAY, Disp: 90 tablet, Rfl: 1   benazepril (LOTENSIN) 40 MG tablet, Take 40 mg by mouth daily at 12 noon., Disp: , Rfl:    erythromycin ophthalmic ointment, Place a 1/2 inch ribbon of ointment into the lower eyelid left eye twice daily for 7 days. (Patient not taking: Reported on 11/18/2021), Disp: 3.5 g, Rfl: 0   esomeprazole (NEXIUM) 40 MG capsule, Take 40 mg by mouth daily at 12 noon., Disp: , Rfl:    gabapentin (NEURONTIN) 300 MG capsule, Take 300 mg by mouth 2 (two) times daily., Disp: , Rfl:    hydrochlorothiazide (HYDRODIURIL) 25 MG tablet, Take 1 tablet (25 mg total) by mouth daily. (Patient taking differently: Take 25 mg by mouth daily at 12 noon.), Disp: 30 tablet, Rfl: 0   metoprolol succinate (TOPROL-XL) 25 MG 24 hr tablet, TAKE 1  TABLET BY MOUTH EVERY DAY, Disp: 90 tablet, Rfl: 3   metoprolol tartrate (LOPRESSOR) 25 MG tablet, Take 1 tablet (25 mg total) by mouth 2 (two) times daily as needed for up to 30 doses (For active palpitations/ A Fib)., Disp: 30 tablet, Rfl: 0   metoprolol tartrate (LOPRESSOR) 50 MG tablet, Take 1 tablet by mouth once for procedure. (Patient not taking: Reported on 11/18/2021), Disp: 1 tablet, Rfl: 0   minocycline (MINOCIN,DYNACIN) 100 MG capsule, Take 1 capsule (100 mg total) by mouth 2 (two) times daily., Disp: 180 capsule, Rfl: 1   rivaroxaban (XARELTO) 20 MG TABS tablet, Take 1 tablet (20 mg total) by mouth daily with supper. (Patient not taking: Reported on 11/18/2021), Disp: 30 tablet, Rfl: 0   rizatriptan (MAXALT) 10 MG tablet, TAKE 1 TABLET BY MOUTH ONCE AS NEEDED FOR MIGRAINE FOR UP TO 1 DOSE. MAY REPEAT IN 2 HOURS IF NEEDED, Disp: , Rfl:    sildenafil (REVATIO) 20 MG tablet, Take 20-60 mg by mouth daily as needed (ED)., Disp: , Rfl:    traMADol (ULTRAM) 50 MG tablet, Take 50 mg by mouth every 6 (six) hours as needed., Disp: , Rfl:    trimethoprim-polymyxin b (POLYTRIM) ophthalmic solution, Place 1 drop into both eyes every 6 (six) hours. (Patient not taking: Reported on 11/18/2021), Disp: 10 mL, Rfl: 0   Medications ordered in this encounter:  Meds ordered this encounter  Medications   fluticasone (FLONASE) 50 MCG/ACT nasal spray    Sig: Place 2 sprays into both nostrils daily.    Dispense:  16 g    Refill:  0    Order Specific Question:   Supervising Provider    Answer:   Chase Picket [id number]   promethazine-dextromethorphan (PROMETHAZINE-DM) 6.25-15 MG/5ML syrup    Sig: Take 5 mLs by mouth 4 (four) times daily as needed for cough.    Dispense:  118 mL    Refill:  0    Order Specific Question:   Supervising Provider    Answer:   Chase Picket [[id number]]     *If you need refills on other medications prior to your next appointment, please contact your  pharmacy*  Follow-Up: Call back or seek an in-person evaluation if the symptoms worsen or if the condition fails to improve as anticipated.  [facility] [phone]  Other Instructions  Rest Hydrate Retest for COVID if worse tomorrow  Keep a low threshold for going in if heart feels like sustaining high rates   If you have been instructed to have an in-person evaluation today at a local Urgent Care facility, please use the link below. It will take you to a list of all of our available [facility], including address, phone number and hours of operation. Please do not delay care.  [facility]  If you or a family member do not have a primary care provider, use the link below to schedule a visit and establish care. When you choose a [facility] primary care physician or advanced practice provider, you gain a long-term partner in health. Find a Primary Care Provider  Learn more about [facility]'s in-office and virtual care options: [facility]

## 2021-12-29 NOTE — Progress Notes (Signed)
Virtual Visit Consent   Vincent Summers, you are scheduled for a virtual visit with a [facility] provider today. Just as with appointments in the office, your consent must be obtained to participate. Your consent will be active for this visit and any virtual visit you may have with one of our providers in the next 365 days. If you have a MyChart account, a copy of this consent can be sent to you electronically.  As this is a virtual visit, video technology does not allow for your provider to perform a traditional examination. This may limit your provider's ability to fully assess your condition. If your provider identifies any concerns that need to be evaluated in person or the need to arrange testing (such as labs, EKG, etc.), we will make arrangements to do so. Although advances in technology are sophisticated, we cannot ensure that it will always work on either your end or our end. If the connection with a video visit is poor, the visit may have to be switched to a telephone visit. With either a video or telephone visit, we are not always able to ensure that we have a secure connection.  By engaging in this virtual visit, you consent to the provision of healthcare and authorize for your insurance to be billed (if applicable) for the services provided during this visit. Depending on your insurance coverage, you may receive a charge related to this service.  I need to obtain your verbal consent now. Are you willing to proceed with your visit today? Vincent Summers has provided verbal consent on 12/29/2021 for a virtual visit (video or telephone). Vincent Mayo, NP  Date: 12/29/2021 3:27 PM  Virtual Visit via Video Note   I, Vincent Summers, connected with  Vincent Summers  ([id number], 01/07/1961) on 12/29/21 at  3:30 PM EDT by a video-enabled telemedicine application and verified that I am speaking with the correct person using two identifiers.  Location: Patient: Virtual Visit Location Patient:  Home Provider: Virtual Visit Location Provider: Home Office   I discussed the limitations of evaluation and management by telemedicine and the availability of in person appointments. The patient expressed understanding and agreed to proceed.    History of Present Illness: Vincent Summers is a 61 y.o. who identifies as a male who was assigned male at birth, and is being seen today for cold symptoms.  Onset of symptoms was Saturday evening 10/7/203. Nausea, diarrhea, achy, fevers-100.5, runny nose, cough, no energy, sore throat, and ear pain. Denies chest pain and shortness of breath.  COVID negative; son is sick as well- negative for covid, flu also Reports history of Afib- taking PRN medication has since being sick has noted a little increase in afib. But nothing sustaining- per him.  Problems:  Patient Active Problem List   Diagnosis Date Noted   NAFLD (nonalcoholic fatty liver disease) 04/15/2014   Dyslipidemia 04/15/2014   Chest pain 02/28/2014   GERD (gastroesophageal reflux disease) 11/21/2013   Arthritis    Hypertension    Anxiety     Allergies:  Allergies  Allergen Reactions   Fenofibrate Other (See Comments)    Back pain Back pain Back pain Back pain    Medications:  Current Outpatient Medications:    acetaminophen (TYLENOL) 500 MG tablet, Take 1,000 mg by mouth every 6 (six) hours as needed for moderate pain or headache., Disp: , Rfl:    ALPRAZolam (XANAX) 0.5 MG tablet, Take 0.5 mg by mouth at bedtime as  needed for anxiety. TAKE 3 TIMES DAILY PRN, Disp: , Rfl:    apixaban (ELIQUIS) 5 MG TABS tablet, Take 1 tablet (5 mg total) by mouth 2 (two) times daily., Disp: 60 tablet, Rfl: 2   atorvastatin (LIPITOR) 20 MG tablet, TAKE 1 TABLET BY MOUTH EVERY DAY, Disp: 90 tablet, Rfl: 1   benazepril (LOTENSIN) 40 MG tablet, Take 40 mg by mouth daily at 12 noon., Disp: , Rfl:    erythromycin ophthalmic ointment, Place a 1/2 inch ribbon of ointment into the lower eyelid left eye  twice daily for 7 days. (Patient not taking: Reported on 11/18/2021), Disp: 3.5 g, Rfl: 0   esomeprazole (NEXIUM) 40 MG capsule, Take 40 mg by mouth daily at 12 noon., Disp: , Rfl:    gabapentin (NEURONTIN) 300 MG capsule, Take 300 mg by mouth 2 (two) times daily., Disp: , Rfl:    hydrochlorothiazide (HYDRODIURIL) 25 MG tablet, Take 1 tablet (25 mg total) by mouth daily. (Patient taking differently: Take 25 mg by mouth daily at 12 noon.), Disp: 30 tablet, Rfl: 0   metoprolol succinate (TOPROL-XL) 25 MG 24 hr tablet, TAKE 1 TABLET BY MOUTH EVERY DAY, Disp: 90 tablet, Rfl: 3   metoprolol tartrate (LOPRESSOR) 25 MG tablet, Take 1 tablet (25 mg total) by mouth 2 (two) times daily as needed for up to 30 doses (For active palpitations/ A Fib)., Disp: 30 tablet, Rfl: 0   metoprolol tartrate (LOPRESSOR) 50 MG tablet, Take 1 tablet by mouth once for procedure. (Patient not taking: Reported on 11/18/2021), Disp: 1 tablet, Rfl: 0   minocycline (MINOCIN,DYNACIN) 100 MG capsule, Take 1 capsule (100 mg total) by mouth 2 (two) times daily., Disp: 180 capsule, Rfl: 1   rivaroxaban (XARELTO) 20 MG TABS tablet, Take 1 tablet (20 mg total) by mouth daily with supper. (Patient not taking: Reported on 11/18/2021), Disp: 30 tablet, Rfl: 0   rizatriptan (MAXALT) 10 MG tablet, TAKE 1 TABLET BY MOUTH ONCE AS NEEDED FOR MIGRAINE FOR UP TO 1 DOSE. MAY REPEAT IN 2 HOURS IF NEEDED, Disp: , Rfl:    sildenafil (REVATIO) 20 MG tablet, Take 20-60 mg by mouth daily as needed (ED)., Disp: , Rfl:    traMADol (ULTRAM) 50 MG tablet, Take 50 mg by mouth every 6 (six) hours as needed., Disp: , Rfl:    trimethoprim-polymyxin b (POLYTRIM) ophthalmic solution, Place 1 drop into both eyes every 6 (six) hours. (Patient not taking: Reported on 11/18/2021), Disp: 10 mL, Rfl: 0  Observations/Objective: Patient is well-developed, well-nourished in no acute distress.  Resting comfortably  at home.  Head is normocephalic, atraumatic.  No labored  breathing.  Speech is clear and coherent with logical content.  Patient is alert and oriented at baseline.  Congestion, runny nose-nasal tone and hoarseness  Assessment and Plan: 1. Viral URI with cough  - fluticasone (FLONASE) 50 MCG/ACT nasal spray; Place 2 sprays into both nostrils daily.  Dispense: 16 g; Refill: 0 - promethazine-dextromethorphan (PROMETHAZINE-DM) 6.25-15 MG/5ML syrup; Take 5 mLs by mouth 4 (four) times daily as needed for cough.  Dispense: 118 mL; Refill: 0   -rest -hydrate -continue afib medications as directed by cards -OTC reviewed -no red flags today during visit, advised to keep low threshold for in person given heart history and condition   Reviewed side effects, risks and benefits of medication.    Patient acknowledged agreement and understanding of the plan.   Past Medical, Surgical, Social History, Allergies, and Medications have been Reviewed.  Follow Up Instructions: I discussed the assessment and treatment plan with the patient. The patient was provided an opportunity to ask questions and all were answered. The patient agreed with the plan and demonstrated an understanding of the instructions.  A copy of instructions were sent to the patient via MyChart unless otherwise noted below.     The patient was advised to call back or seek an in-person evaluation if the symptoms worsen or if the condition fails to improve as anticipated.  Time:  I spent 10 minutes with the patient via telehealth technology discussing the above problems/concerns.    Vincent Mayo, NP

## 2022-01-27 MED ORDER — METOPROLOL SUCCINATE ER 25 MG PO TB24: 25.0000 mg | ORAL_TABLET | Freq: Every day | ORAL | 1 refills | Status: DC

## 2022-02-01 ENCOUNTER — Other Ambulatory Visit: Payer: Self-pay

## 2022-02-01 MED ORDER — METOPROLOL TARTRATE 25 MG PO TABS: 25.0000 mg | ORAL_TABLET | Freq: Two times a day (BID) | ORAL | 6 refills | Status: DC | PRN

## 2022-02-28 ENCOUNTER — Other Ambulatory Visit: Payer: Self-pay | Admitting: Internal Medicine

## 2022-03-01 NOTE — Telephone Encounter (Signed)
Prescription refill request for Eliquis received. Indication:afib Last office visit:8/23 Scr:0.9 Age: 61 Weight:99.3  kg  Prescription refilled

## 2022-03-12 ENCOUNTER — Encounter: Payer: Self-pay | Admitting: Internal Medicine

## 2022-03-12 ENCOUNTER — Ambulatory Visit: Payer: 59 | Attending: Internal Medicine | Admitting: Internal Medicine

## 2022-03-12 VITALS — BP 146/81 | HR 89 | Ht 73.0 in | Wt 224.0 lb

## 2022-03-12 DIAGNOSIS — R002 Palpitations: Secondary | ICD-10-CM | POA: Diagnosis not present

## 2022-03-12 DIAGNOSIS — R079 Chest pain, unspecified: Secondary | ICD-10-CM

## 2022-03-12 DIAGNOSIS — E782 Mixed hyperlipidemia: Secondary | ICD-10-CM | POA: Diagnosis not present

## 2022-03-12 DIAGNOSIS — I2510 Atherosclerotic heart disease of native coronary artery without angina pectoris: Secondary | ICD-10-CM | POA: Diagnosis not present

## 2022-03-12 MED ORDER — METOPROLOL SUCCINATE ER 50 MG PO TB24: 50.0000 mg | ORAL_TABLET | Freq: Every day | ORAL | 3 refills | Status: DC

## 2022-03-12 NOTE — Progress Notes (Signed)
OFFICE NOTE  Chief Complaint:  Chest pain, palps  Primary Care Physician: Vincent Noon, MD  HPI:  Vincent Summers is a pleasant 61 year old male with a strong family history of coronary disease. He reports that his father had heart disease and prior bypass as well as a defibrillator implanted and died at age 36. There is also extensive heart disease and no sisters who have arrhythmias and a pacemaker as well as in his grandparents. He does have a history of anxiety is well. He's recently had some chest pain symptoms which are reminiscent of pain these had many years in the past. In 2007 he complained of some chest pain and underwent cardiac catheterization by Dr. Haroldine Summers which demonstrated essentially normal coronary arteries with normal LV function. He's developed some mild hypertension and is on medication for that but was told that he has good cholesterol control with a total cholesterol less than 200. Recently he's been having some discomfort in his chest and tingling in his left arm. The symptoms are little bit worse with exertion and relieved somewhat by rest but can occur at other times without doing activities. 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  Mr. Vincent Summers returns today for follow-up. He underwent an exercise stress test which was negative for ischemia but did show hypertensive response. He had a CT calcium score which resulted is 0. This is highly suggestive of low risk of coronary events. Incidentally, it was noted by the radiologist that there is a nonalcoholic fatty liver disease. It is noted that his cholesterol is elevated and he reports drinking a couple of beers daily after work.  06/22/2019  Vincent Summers is seen today for acute chest pain.  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  This is described as substernal nonradiating but more intense than any pain has had before.  I last saw him for this  and evaluated him in January 2016 therefore he is considered a new patient.  As mentioned above he underwent cardiac catheterization by Dr. Haroldine Summers in 2007 which showed essentially normal coronaries.  10/10/2019  Vincent Summers returns today for follow-up.  He still gets some intermittent episodes of chest discomfort.  He underwent CT coronary angiography which demonstrated calcium score 49.4 and mild noncalcified coronary disease of all the major coronaries.  This is actually some progression of disease which is significant from his calcium score which was 0 in 2016 and essentially normal coronaries back by heart catheterization in 07.  Given a strong family history of heart disease, I suspect there is another reason why has had progression of his coronary disease.  He had repeat lipids from his PCP yesterday which showed total cholesterol 128, triglycerides 154, HDL 39 and LDL 62.  This represents excellent control and he is on a low-dose statin.  He was able to tolerate 50 mg of metoprolol for CT.  He also reports recently has been having some more palpitations.  04/15/2020  Vincent Summers returns today for follow-up.  Intermittently has episodes of chest discomfort still despite recent CT coronary angiogram last year that showed low calcium score 49 he does have coronary disease to multiple vessels however mild.  This is a progression from a calcium score which was 0 in 2016 and mild coronary disease in 2007 by heart cath.  He does have a strong family history of heart disease.  LP(a) was drawn which  was negative.  His lipids generally show good control.  I encouraged more exercise and activity.  I would also like him to be on low-dose aspirin which she does not currently take.  03/24/2021  Vincent Summers returns today for follow-up.  He seems to be doing fairly well except for one episode that he experienced at work of left-sided chest discomfort.  It was somewhat dull such as a pressure and did not radiate.  It  came on and lasted for about 5 to 10 minutes when he was in a "breakout session" at work.  He has not had episodes since then.  He did have a CT coronary angiogram last April which showed minimal coronary disease.  He denies any worsening chest pain or shortness of breath with exertion and is physically active at his job walking probably 7 to 9 miles a day  03/12/2022  Vincent Summers is seen today in follow-up.  He reports she has had quite a bit of stress recently.  This morning he had chest pain which lasted for short period of time followed by some palpitations.  He also had another episode a little while back.  This was reportedly "severe".  He has had more recent palpitations however they are short-lived.  2 short, in fact to consider using short acting metoprolol.  EKG today shows a normal sinus rhythm.  PMHx:  Past Medical History:  Diagnosis Date   Anxiety    Arthritis    GERD (gastroesophageal reflux disease)    History of hiatal hernia    Hypercholesteremia    Hypertension    NAFL (nonalcoholic fatty liver)    Palpitations    Rosacea    Spinal stenosis at L4-L5 level     Past Surgical History:  Procedure Laterality Date   APPENDECTOMY     CARDIAC CATHETERIZATION  2007   Dr. Haroldine Summers   INSERTION OF MESH N/A 11/28/2018   Procedure: Insertion Of Mesh;  Surgeon: Vincent Mesa, MD;  Location: [place];  Service: General;  Laterality: N/A;   KNEE SURGERY     SHOULDER SURGERY     TONSILLECTOMY     VENTRAL HERNIA REPAIR N/A 11/28/2018   Procedure: LAPAROSCOPIC VENTRAL HERNIA REPAIR;  Surgeon: Vincent Mesa, MD;  Location: [place];  Service: General;  Laterality: N/A;    FAMHx:  Family History  Problem Relation Age of Onset   Emphysema Mother    Skin cancer Mother    Leukemia Father    Heart disease Father 34       CAD---CABG @ age 2   Arrhythmia Father    Arrhythmia Sister    Stroke Maternal Grandmother 90   Heart disease Maternal Grandfather    Stroke Maternal Grandfather 31    Hypertension Paternal Grandmother    Stroke Paternal Grandmother 85   Heart disease Paternal Grandfather 95    SOCHx:   reports that he has never smoked. He has never used smokeless tobacco. He reports current alcohol use of about 14.0 standard drinks of alcohol per week. He reports that he does not use drugs.  ALLERGIES:  Allergies  Allergen Reactions   Fenofibrate Other (See Comments)    Back pain Back pain Back pain Back pain     ROS: A comprehensive review of systems was negative.  HOME MEDS: Current Outpatient Medications  Medication Sig Dispense Refill   acetaminophen (TYLENOL) 500 MG tablet Take 1,000 mg by mouth every 6 (six) hours as needed for moderate pain or headache.  ALPRAZolam (XANAX) 0.5 MG tablet Take 0.5 mg by mouth at bedtime as needed for anxiety. TAKE 3 TIMES DAILY PRN     apixaban (ELIQUIS) 5 MG TABS tablet TAKE 1 TABLET BY MOUTH TWICE A DAY 60 tablet 5   atorvastatin (LIPITOR) 20 MG tablet TAKE 1 TABLET BY MOUTH EVERY DAY 90 tablet 1   benazepril (LOTENSIN) 40 MG tablet Take 40 mg by mouth daily at 12 Summers.     erythromycin ophthalmic ointment Place a 1/2 inch ribbon of ointment into the lower eyelid left eye twice daily for 7 days. 3.5 g 0   fluticasone (FLONASE) 50 MCG/ACT nasal spray Place 2 sprays into both nostrils daily. 16 g 0   gabapentin (NEURONTIN) 300 MG capsule Take 300 mg by mouth 2 (two) times daily.     hydrochlorothiazide (HYDRODIURIL) 25 MG tablet Take 1 tablet (25 mg total) by mouth daily. (Patient taking differently: Take 25 mg by mouth daily at 12 Summers.) 30 tablet 0   metoprolol succinate (TOPROL-XL) 25 MG 24 hr tablet Take 1 tablet (25 mg total) by mouth daily. 90 tablet 1   metoprolol tartrate (LOPRESSOR) 25 MG tablet Take 1 tablet (25 mg total) by mouth 2 (two) times daily as needed for up to 210 doses (For active palpitations/ A Fib). 30 tablet 6   minocycline (MINOCIN,DYNACIN) 100 MG capsule Take 1 capsule (100 mg total) by  mouth 2 (two) times daily. 180 capsule 1   rizatriptan (MAXALT) 10 MG tablet TAKE 1 TABLET BY MOUTH ONCE AS NEEDED FOR MIGRAINE FOR UP TO 1 DOSE. MAY REPEAT IN 2 HOURS IF NEEDED     sildenafil (REVATIO) 20 MG tablet Take 20-60 mg by mouth daily as needed (ED).     traMADol (ULTRAM) 50 MG tablet Take 50 mg by mouth every 6 (six) hours as needed.     esomeprazole (NEXIUM) 40 MG capsule Take 40 mg by mouth daily at 12 Summers. (Patient not taking: Reported on 03/12/2022)     metoprolol tartrate (LOPRESSOR) 50 MG tablet Take 1 tablet by mouth once for procedure. (Patient not taking: Reported on 03/12/2022) 1 tablet 0   pantoprazole (PROTONIX) 40 MG tablet Take 40 mg by mouth daily.     promethazine-dextromethorphan (PROMETHAZINE-DM) 6.25-15 MG/5ML syrup Take 5 mLs by mouth 4 (four) times daily as needed for cough. (Patient not taking: Reported on 03/12/2022) 118 mL 0   rivaroxaban (XARELTO) 20 MG TABS tablet Take 1 tablet (20 mg total) by mouth daily with supper. (Patient not taking: Reported on 03/12/2022) 30 tablet 0   trimethoprim-polymyxin b (POLYTRIM) ophthalmic solution Place 1 drop into both eyes every 6 (six) hours. (Patient not taking: Reported on 03/12/2022) 10 mL 0   No current facility-administered medications for this visit.    LABS/IMAGING: No results found for this or any previous visit (from the past 48 hour(s)). No results found.  VITALS: BP (!) 146/81 (BP Location: Left Arm, Patient Position: Sitting, Cuff Size: Normal)   Pulse 89   Ht '6\' 1"'$  (1.854 m)   Wt 224 lb (101.6 kg)   SpO2 100%   BMI 29.55 kg/m   EXAM: General appearance: alert and no distress Neck: no carotid bruit, no JVD and thyroid not enlarged, symmetric, no tenderness/mass/nodules Lungs: clear to auscultation bilaterally Heart: regular rate and rhythm Abdomen: soft, non-tender; bowel sounds normal; no masses,  no organomegaly Extremities: extremities normal, atraumatic, no cyanosis or edema Pulses: 2+ and  symmetric Skin: Skin color, texture, turgor normal.  No rashes or lesions Neurologic: Grossly normal Psych: Pleasant  EKG: Sinus rhythm at 63 -personally reviewed  ASSESSMENT: Chest pain -CT coronary angiogram showed mild multivessel coronary disease with calcium score 49 (06/2019) History of more atypical chest pain - negative exercise stress test (2016)- Zero calcium score Minimal CAD by cath in 2007 Strong family history of coronary disease Hypertension Dyslipidemia NAFLD Palpitations  PLAN: 1.   Mr. Varma has had a couple episodes of chest pain recently and palpitations.  He has been under a lot more stress recently.  He has episodes of palpitations breakthrough quickly and do not last long enough for him to get improvement with short acting metoprolol.  I would advise an increase in his long-acting metoprolol to counter this and this might help him with some of his chest pain if it is angina.  He should monitor the symptoms and if they worsen we may need to consider antianginal therapy or repeat coronary evaluation.  Follow-up with me in 6 months or sooner with necessary.  Pixie Casino, MD, [facility], [place] Director of the Advanced Lipid Disorders &  Cardiovascular Risk Reduction Clinic Diplomate of the American Board of Clinical Lipidology Attending Cardiologist  Direct Dial: [phone]  Fax: [phone]  Website:  [email or web address] Proby 03/12/2022, 11:49 AM

## 2022-03-12 NOTE — Patient Instructions (Signed)
Medication Instructions:  Your physician has recommended you make the following change in your medication:   -Increase metoprolol succinate (toprol- xl) to '50mg'$  once daily.  *If you need a refill on your cardiac medications before your next appointment, please call your pharmacy*   Follow-Up: At [facility], you and your health needs are our priority.  As part of our continuing mission to provide you with exceptional heart care, we have created designated Provider Care Teams.  These Care Teams include your primary Cardiologist (physician) and Advanced Practice Providers (APPs -  Physician Assistants and Nurse Practitioners) who all work together to provide you with the care you need, when you need it.  We recommend signing up for the patient portal called "MyChart".  Sign up information is provided on this After Visit Summary.  MyChart is used to connect with patients for Virtual Visits (Telemedicine).  Patients are able to view lab/test results, encounter notes, upcoming appointments, etc.  Non-urgent messages can be sent to your provider as well.   To learn more about what you can do with MyChart, go to [email or web address].    Your next appointment:   5 month(s)  The format for your next appointment:   In Person  Provider:   Pixie Casino, MD

## 2022-03-29 MED ORDER — APIXABAN 5 MG PO TABS: 5.0000 mg | ORAL_TABLET | Freq: Two times a day (BID) | ORAL | 0 refills | Status: DC

## 2022-03-29 NOTE — Telephone Encounter (Signed)
Prescription refill request for Eliquis received. Indication:  AF Last office visit: 03/12/22 Scr: 0.95 Age:  61 Weight:  101.6 kg

## 2022-08-05 ENCOUNTER — Encounter: Payer: Self-pay | Admitting: Internal Medicine

## 2022-08-05 ENCOUNTER — Ambulatory Visit: Payer: 59 | Attending: Internal Medicine | Admitting: Internal Medicine

## 2022-08-05 VITALS — BP 124/76 | HR 58 | Ht 73.0 in | Wt 240.2 lb

## 2022-08-05 DIAGNOSIS — E782 Mixed hyperlipidemia: Secondary | ICD-10-CM | POA: Diagnosis not present

## 2022-08-05 DIAGNOSIS — I10 Essential (primary) hypertension: Secondary | ICD-10-CM

## 2022-08-05 DIAGNOSIS — I480 Paroxysmal atrial fibrillation: Secondary | ICD-10-CM

## 2022-08-05 DIAGNOSIS — I2510 Atherosclerotic heart disease of native coronary artery without angina pectoris: Secondary | ICD-10-CM | POA: Diagnosis not present

## 2022-08-05 NOTE — Progress Notes (Signed)
OFFICE NOTE  Chief Complaint:  Chest pain, palps  Primary Care Physician: Chesley Noon, MD  HPI:  Vincent Summers is a pleasant 62 year old male with a strong family history of coronary disease. He reports that his father had heart disease and prior bypass as well as a defibrillator implanted and died at age 36. There is also extensive heart disease and no sisters who have arrhythmias and a pacemaker as well as in his grandparents. He does have a history of anxiety is well. He's recently had some chest pain symptoms which are reminiscent of pain these had many years in the past. In 2007 he complained of some chest pain and underwent cardiac catheterization by Dr. Haroldine Laws which demonstrated essentially normal coronary arteries with normal LV function. He's developed some mild hypertension and is on medication for that but was told that he has good cholesterol control with a total cholesterol less than 200. Recently he's been having some discomfort in his chest and tingling in his left arm. The symptoms are little bit worse with exertion and relieved somewhat by rest but can occur at other times without doing activities. 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  Vincent Summers returns today for follow-up. He underwent an exercise stress test which was negative for ischemia but did show hypertensive response. He had a CT calcium score which resulted is 0. This is highly suggestive of low risk of coronary events. Incidentally, it was noted by the radiologist that there is a nonalcoholic fatty liver disease. It is noted that his cholesterol is elevated and he reports drinking a couple of beers daily after work.  06/22/2019  Vincent Summers is seen today for acute chest pain.  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  This is described as substernal nonradiating but more intense than any pain has had before.  I last saw him for this  and evaluated him in January 2016 therefore he is considered a new patient.  As mentioned above he underwent cardiac catheterization by Dr. Haroldine Laws in 2007 which showed essentially normal coronaries.  10/10/2019  Vincent Summers returns today for follow-up.  He still gets some intermittent episodes of chest discomfort.  He underwent CT coronary angiography which demonstrated calcium score 49.4 and mild noncalcified coronary disease of all the major coronaries.  This is actually some progression of disease which is significant from his calcium score which was 0 in 2016 and essentially normal coronaries back by heart catheterization in 07.  Given a strong family history of heart disease, I suspect there is another reason why has had progression of his coronary disease.  He had repeat lipids from his PCP yesterday which showed total cholesterol 128, triglycerides 154, HDL 39 and LDL 62.  This represents excellent control and he is on a low-dose statin.  He was able to tolerate 50 mg of metoprolol for CT.  He also reports recently has been having some more palpitations.  04/15/2020  Vincent Summers returns today for follow-up.  Intermittently has episodes of chest discomfort still despite recent CT coronary angiogram last year that showed low calcium score 49 he does have coronary disease to multiple vessels however mild.  This is a progression from a calcium score which was 0 in 2016 and mild coronary disease in 2007 by heart cath.  He does have a strong family history of heart disease.  LP(a) was drawn which  was negative.  His lipids generally show good control.  I encouraged more exercise and activity.  I would also like him to be on low-dose aspirin which she does not currently take.  03/24/2021  Vincent Summers returns today for follow-up.  He seems to be doing fairly well except for one episode that he experienced at work of left-sided chest discomfort.  It was somewhat dull such as a pressure and did not radiate.  It  came on and lasted for about 5 to 10 minutes when he was in a "breakout session" at work.  He has not had episodes since then.  He did have a CT coronary angiogram last April which showed minimal coronary disease.  He denies any worsening chest pain or shortness of breath with exertion and is physically active at his job walking probably 7 to 9 miles a day  03/12/2022  Vincent Summers is seen today in follow-up.  He reports she has had quite a bit of stress recently.  This morning he had chest pain which lasted for short period of time followed by some palpitations.  He also had another episode a little while back.  This was reportedly "severe".  He has had more recent palpitations however they are short-lived.  2 short, in fact to consider using short acting metoprolol.  EKG today shows a normal sinus rhythm.  08/05/2022  Vincent Summers returns today for follow-up.  He says he is feeling very well.  After increasing his beta-blocker previously he has had less episodes of palpitations.  He may be having some infrequent atrial fibrillation.  His CHA2DS2-VASc score is 2 but he is on Eliquis.  He is not have any bleeding issues.  Blood pressure is well-controlled.  EKG today shows sinus bradycardia with heart rate of 58.  PMHx:  Past Medical History:  Diagnosis Date   Anxiety    Arthritis    GERD (gastroesophageal reflux disease)    History of hiatal hernia    Hypercholesteremia    Hypertension    NAFL (nonalcoholic fatty liver)    Palpitations    Rosacea    Spinal stenosis at L4-L5 level     Past Surgical History:  Procedure Laterality Date   APPENDECTOMY     CARDIAC CATHETERIZATION  2007   Dr. Gala Romney   INSERTION OF MESH N/A 11/28/2018   Procedure: Insertion Of Mesh;  Surgeon: Manus Rudd, MD;  Location: [facility] OR;  Service: General;  Laterality: N/A;   KNEE SURGERY     SHOULDER SURGERY     TONSILLECTOMY     VENTRAL HERNIA REPAIR N/A 11/28/2018   Procedure: LAPAROSCOPIC VENTRAL HERNIA REPAIR;   Surgeon: Manus Rudd, MD;  Location: [facility] OR;  Service: General;  Laterality: N/A;    FAMHx:  Family History  Problem Relation Age of Onset   Emphysema Mother    Skin cancer Mother    Leukemia Father    Heart disease Father 59       CAD---CABG @ age 79   Arrhythmia Father    Arrhythmia Sister    Stroke Maternal Grandmother 59   Heart disease Maternal Grandfather    Stroke Maternal Grandfather 70   Hypertension Paternal Grandmother    Stroke Paternal Grandmother 23   Heart disease Paternal Grandfather 73    SOCHx:   reports that he has never smoked. He has never used smokeless tobacco. He reports current alcohol use of about 14.0 standard drinks of alcohol per week. He reports that he does  not use drugs.  ALLERGIES:  Allergies  Allergen Reactions   Fenofibrate Other (See Comments)    Back pain Back pain Back pain Back pain     ROS: A comprehensive review of systems was negative.  HOME MEDS: Current Outpatient Medications  Medication Sig Dispense Refill   acetaminophen (TYLENOL) 500 MG tablet Take 1,000 mg by mouth every 6 (six) hours as needed for moderate pain or headache.     ALPRAZolam (XANAX) 0.5 MG tablet Take 0.5 mg by mouth at bedtime as needed for anxiety. TAKE 3 TIMES DAILY PRN     apixaban (ELIQUIS) 5 MG TABS tablet TAKE 1 TABLET BY MOUTH TWICE A DAY 60 tablet 5   apixaban (ELIQUIS) 5 MG TABS tablet Take 1 tablet (5 mg total) by mouth 2 (two) times daily. 28 tablet 0   atorvastatin (LIPITOR) 20 MG tablet TAKE 1 TABLET BY MOUTH EVERY DAY 90 tablet 1   benazepril (LOTENSIN) 40 MG tablet Take 40 mg by mouth daily at 12 noon.     erythromycin ophthalmic ointment Place a 1/2 inch ribbon of ointment into the lower eyelid left eye twice daily for 7 days. 3.5 g 0   fluticasone (FLONASE) 50 MCG/ACT nasal spray Place 2 sprays into both nostrils daily. 16 g 0   gabapentin (NEURONTIN) 300 MG capsule Take 300 mg by mouth 2 (two) times daily.     metoprolol succinate  (TOPROL-XL) 50 MG 24 hr tablet Take 1 tablet (50 mg total) by mouth daily. 90 tablet 3   metoprolol tartrate (LOPRESSOR) 25 MG tablet Take 1 tablet (25 mg total) by mouth 2 (two) times daily as needed for up to 210 doses (For active palpitations/ A Fib). 30 tablet 6   minocycline (MINOCIN,DYNACIN) 100 MG capsule Take 1 capsule (100 mg total) by mouth 2 (two) times daily. 180 capsule 1   pantoprazole (PROTONIX) 40 MG tablet Take 40 mg by mouth daily.     promethazine-dextromethorphan (PROMETHAZINE-DM) 6.25-15 MG/5ML syrup Take 5 mLs by mouth 4 (four) times daily as needed for cough. (Patient not taking: Reported on 03/12/2022) 118 mL 0   rizatriptan (MAXALT) 10 MG tablet TAKE 1 TABLET BY MOUTH ONCE AS NEEDED FOR MIGRAINE FOR UP TO 1 DOSE. MAY REPEAT IN 2 HOURS IF NEEDED     sildenafil (REVATIO) 20 MG tablet Take 20-60 mg by mouth daily as needed (ED).     traMADol (ULTRAM) 50 MG tablet Take 50 mg by mouth every 6 (six) hours as needed.     trimethoprim-polymyxin b (POLYTRIM) ophthalmic solution Place 1 drop into both eyes every 6 (six) hours. (Patient not taking: Reported on 03/12/2022) 10 mL 0   No current facility-administered medications for this visit.    LABS/IMAGING: No results found for this or any previous visit (from the past 48 hour(s)). No results found.  VITALS: Pulse (!) 58   Ht 6\' 1"  (1.854 m)   Wt 240 lb 3.2 oz (109 kg)   SpO2 98%   BMI 31.69 kg/m   EXAM: General appearance: alert and no distress Neck: no carotid bruit, no JVD and thyroid not enlarged, symmetric, no tenderness/mass/nodules Lungs: clear to auscultation bilaterally Heart: regular rate and rhythm Abdomen: soft, non-tender; bowel sounds normal; no masses,  no organomegaly Extremities: extremities normal, atraumatic, no cyanosis or edema Pulses: 2+ and symmetric Skin: Skin color, texture, turgor normal. No rashes or lesions Neurologic: Grossly normal Psych: Pleasant  EKG: Sinus bradycardia at  58-personally reviewed  ASSESSMENT: Chest pain -  CT coronary angiogram showed mild multivessel coronary disease with calcium score 49 (06/2019) History of more atypical chest pain - negative exercise stress test (2016)- Zero calcium score Minimal CAD by cath in 2007 Strong family history of coronary disease Hypertension Dyslipidemia NAFLD PAF- CHADSvasc score of 2, on Eliquis  PLAN: 1.   Mr. Schneider is doing well without any chest pain symptoms.  His palpitations are better controlled.  He reports some very infrequent breakthrough A-fib but is anticoagulated on Eliquis.  No bleeding issues.  Blood pressure is well-controlled.  His cholesterol has been at goal.  No changes to his medicines today.  Follow-up with me annually or sooner with necessary.  Chrystie Nose, MD, [facility], FACP  [place]  [facility]  Medical Director of the Advanced Lipid Disorders &  Cardiovascular Risk Reduction Clinic Diplomate of the American Board of Clinical Lipidology Attending Cardiologist  Direct Dial: [phone]  Fax: [phone]  Website:  [email or web address] Isador Castille 08/05/2022, 11:41 AM

## 2022-08-05 NOTE — Patient Instructions (Signed)
Medication Instructions:  Continue same medcations *If you need a refill on your cardiac medications before your next appointment, please call your pharmacy*   Lab Work: None ordered   Testing/Procedures: None ordered   Follow-Up: At [facility], you and your health needs are our priority.  As part of our continuing mission to provide you with exceptional heart care, we have created designated Provider Care Teams.  These Care Teams include your primary Cardiologist (physician) and Advanced Practice Providers (APPs -  Physician Assistants and Nurse Practitioners) who all work together to provide you with the care you need, when you need it.  We recommend signing up for the patient portal called "MyChart".  Sign up information is provided on this After Visit Summary.  MyChart is used to connect with patients for Virtual Visits (Telemedicine).  Patients are able to view lab/test results, encounter notes, upcoming appointments, etc.  Non-urgent messages can be sent to your provider as well.   To learn more about what you can do with MyChart, go to [email or web address].    Your next appointment:  1 year    Call in Feb to schedule May appointment     Provider:  Dr.Hilty

## 2022-08-25 ENCOUNTER — Emergency Department (HOSPITAL_BASED_OUTPATIENT_CLINIC_OR_DEPARTMENT_OTHER): Payer: Medicaid Other | Admitting: Radiology

## 2022-08-25 ENCOUNTER — Other Ambulatory Visit (HOSPITAL_BASED_OUTPATIENT_CLINIC_OR_DEPARTMENT_OTHER): Payer: Self-pay

## 2022-08-25 ENCOUNTER — Other Ambulatory Visit: Payer: Self-pay

## 2022-08-25 ENCOUNTER — Encounter (HOSPITAL_BASED_OUTPATIENT_CLINIC_OR_DEPARTMENT_OTHER): Payer: Self-pay | Admitting: Emergency Medicine

## 2022-08-25 ENCOUNTER — Emergency Department (HOSPITAL_BASED_OUTPATIENT_CLINIC_OR_DEPARTMENT_OTHER)
Admission: EM | Admit: 2022-08-25 | Discharge: 2022-08-25 | Disposition: A | Discharge status: Home / Self Care | Payer: Medicaid Other | Attending: Emergency Medicine | Admitting: Emergency Medicine

## 2022-08-25 DIAGNOSIS — I10 Essential (primary) hypertension: Secondary | ICD-10-CM | POA: Diagnosis not present

## 2022-08-25 DIAGNOSIS — Z79899 Other long term (current) drug therapy: Secondary | ICD-10-CM | POA: Insufficient documentation

## 2022-08-25 DIAGNOSIS — R091 Pleurisy: Secondary | ICD-10-CM | POA: Diagnosis not present

## 2022-08-25 DIAGNOSIS — Z7901 Long term (current) use of anticoagulants: Secondary | ICD-10-CM | POA: Insufficient documentation

## 2022-08-25 DIAGNOSIS — R079 Chest pain, unspecified: Secondary | ICD-10-CM | POA: Diagnosis present

## 2022-08-25 HISTORY — DX: Unspecified atrial fibrillation: I48.91

## 2022-08-25 LAB — BASIC METABOLIC PANEL
Anion gap: 9 (ref 5–15)
BUN: 13 mg/dL (ref 6–20)
CO2: 28 mmol/L (ref 22–32)
Calcium: 10.1 mg/dL (ref 8.9–10.3)
Chloride: 99 mmol/L (ref 98–111)
Creatinine, Ser: 0.91 mg/dL (ref 0.61–1.24)
GFR, Estimated: >60 mL/min (ref >60)
Glucose, Bld: 123 mg/dL — ABNORMAL HIGH (ref 70–99)
Potassium: 3.7 mmol/L (ref 3.5–5.1)
Sodium: 136 mmol/L (ref 135–145)

## 2022-08-25 LAB — CBC
HCT: 42.7 % (ref 39.0–52.0)
Hemoglobin: 15 g/dL (ref 13.0–17.0)
MCH: 31.3 pg (ref 26.0–34.0)
MCHC: 35.1 g/dL (ref 30.0–36.0)
MCV: 89.1 fL (ref 80.0–100.0)
Platelets: 209 10*3/uL (ref 150–400)
RBC: 4.79 MIL/uL (ref 4.22–5.81)
RDW: 12.3 % (ref 11.5–15.5)
WBC: 6.7 10*3/uL (ref 4.0–10.5)
nRBC: 0 % (ref 0.0–0.2)

## 2022-08-25 LAB — TROPONIN I (HIGH SENSITIVITY)
Troponin I (High Sensitivity): 5 ng/L (ref <18)
Troponin I (High Sensitivity): 4 ng/L (ref <18)

## 2022-08-25 LAB — D-DIMER, QUANTITATIVE: D-Dimer, Quant: <0.27 ug/mL-FEU (ref 0.00–0.50)

## 2022-08-25 MED ORDER — PANTOPRAZOLE SODIUM 40 MG PO TBEC
40.0000 mg | DELAYED_RELEASE_TABLET | Freq: Every day | ORAL | 0 refills | Status: AC
  Filled 2022-08-25: qty 30, 30d supply, fill #0

## 2022-08-25 MED ORDER — ALUM & MAG HYDROXIDE-SIMETH 200-200-20 MG/5ML PO SUSP
30.0000 mL | Freq: Once | ORAL | Status: AC
  Administered 2022-08-25: 30 mL via ORAL
  Filled 2022-08-25: qty 30

## 2022-08-25 NOTE — Discharge Instructions (Addendum)
Continue taking your antacid medications.  Follow-up with your primary care doctor to be rechecked.  Return for recurrent pain shortness of breath or other concerning symptoms

## 2022-08-25 NOTE — ED Provider Notes (Signed)
[facility] EMERGENCY DEPARTMENT AT [facility] Provider Note   CSN: [id number] Arrival date & time: 08/25/22  0745     History  Chief Complaint  Patient presents with   Chest Pain    Vincent Summers is a 62 y.o. male.   Chest Pain    Patient presents to the ED for evaluation of chest pain.  Patient has a history of reflux hypercholesterolemia hypertension anxiety atrial fibrillation.  He denies any history of coronary artery disease or pulmonary embolism.  Patient started having pain off and on for the last few days especially in the morning.  Today when he woke up he had some recurrent chest discomfort somewhat sharp and achy and it also went to his back.  He denies any coughing.  He is felt a little bit short of breath with it.  He denies any leg swelling.  Patient denies any acid reflux symptoms although he does take medications for that.  Home Medications Prior to Admission medications   Medication Sig Start Date End Date Taking? Authorizing Provider  hydrochlorothiazide (HYDRODIURIL) 25 MG tablet Take 25 mg by mouth daily. 07/04/17  Yes [provider]  acetaminophen (TYLENOL) 500 MG tablet Take 1,000 mg by mouth every 6 (six) hours as needed for moderate pain or headache.    [provider]  ALPRAZolam Prudy Feeler) 0.5 MG tablet Take 0.5 mg by mouth at bedtime as needed for anxiety. TAKE 3 TIMES DAILY PRN    [provider]  apixaban (ELIQUIS) 5 MG TABS tablet TAKE 1 TABLET BY MOUTH TWICE A DAY 03/01/22   Hilty, Lisette Abu, MD  apixaban (ELIQUIS) 5 MG TABS tablet Take 1 tablet (5 mg total) by mouth 2 (two) times daily. Patient not taking: Reported on 08/05/2022 03/29/22   Chrystie Nose, MD  atorvastatin (LIPITOR) 20 MG tablet TAKE 1 TABLET BY MOUTH EVERY DAY 11/10/21   Hilty, Lisette Abu, MD  benazepril (LOTENSIN) 40 MG tablet Take 40 mg by mouth daily at 12 noon.    [provider]  erythromycin ophthalmic ointment Place a 1/2 inch ribbon of  ointment into the lower eyelid left eye twice daily for 7 days. Patient not taking: Reported on 08/05/2022 08/26/21   Raspet, Denny Peon K, PA-C  fluticasone (FLONASE) 50 MCG/ACT nasal spray Place 2 sprays into both nostrils daily. 12/29/21   Freddy Finner, NP  gabapentin (NEURONTIN) 300 MG capsule Take 300 mg by mouth 2 (two) times daily.    [provider]  metoprolol succinate (TOPROL-XL) 50 MG 24 hr tablet Take 1 tablet (50 mg total) by mouth daily. 03/12/22   Hilty, Lisette Abu, MD  metoprolol tartrate (LOPRESSOR) 25 MG tablet Take 1 tablet (25 mg total) by mouth 2 (two) times daily as needed for up to 210 doses (For active palpitations/ A Fib). Patient not taking: Reported on 08/05/2022 02/01/22   Chrystie Nose, MD  minocycline (MINOCIN,DYNACIN) 100 MG capsule Take 1 capsule (100 mg total) by mouth 2 (two) times daily. 04/04/15   Donita Brooks, MD  pantoprazole (PROTONIX) 40 MG tablet Take 1 tablet (40 mg total) by mouth daily. 08/25/22   Linwood Dibbles, MD  promethazine-dextromethorphan (PROMETHAZINE-DM) 6.25-15 MG/5ML syrup Take 5 mLs by mouth 4 (four) times daily as needed for cough. Patient not taking: Reported on 03/12/2022 12/29/21   Freddy Finner, NP  rizatriptan (MAXALT) 10 MG tablet TAKE 1 TABLET BY MOUTH ONCE AS NEEDED FOR MIGRAINE FOR UP TO 1 DOSE. MAY REPEAT  IN 2 HOURS IF NEEDED 02/19/20   [provider]  sildenafil (REVATIO) 20 MG tablet Take 20-60 mg by mouth daily as needed (ED).    [provider]  trimethoprim-polymyxin b (POLYTRIM) ophthalmic solution Place 1 drop into both eyes every 6 (six) hours. Patient not taking: Reported on 03/12/2022 04/29/19   Wieters, Fran Lowes C, PA-C      Allergies    Fenofibrate    Review of Systems   Review of Systems  Cardiovascular:  Positive for chest pain.    Physical Exam Updated Vital Signs BP 137/83   Pulse (!) 58   Temp 97.8 F (36.6 C) (Oral)   Resp 13   Ht 1.854 m (6\' 1" )   Wt 109 kg   SpO2 97%   BMI  31.69 kg/m  Physical Exam Vitals and nursing note reviewed.  Constitutional:      General: He is not in acute distress.    Appearance: He is well-developed.  HENT:     Head: Normocephalic and atraumatic.     Right Ear: External ear normal.     Left Ear: External ear normal.  Eyes:     General: No scleral icterus.       Right eye: No discharge.        Left eye: No discharge.     Conjunctiva/sclera: Conjunctivae normal.  Neck:     Trachea: No tracheal deviation.  Cardiovascular:     Rate and Rhythm: Normal rate and regular rhythm.  Pulmonary:     Effort: Pulmonary effort is normal. No respiratory distress.     Breath sounds: Normal breath sounds. No stridor. No wheezing or rales.  Abdominal:     General: Bowel sounds are normal. There is no distension.     Palpations: Abdomen is soft.     Tenderness: There is no abdominal tenderness. There is no guarding or rebound.  Musculoskeletal:        General: No tenderness or deformity.     Cervical back: Neck supple.     Right lower leg: No tenderness. No edema.     Left lower leg: No tenderness. No edema.  Skin:    General: Skin is warm and dry.     Findings: No rash.  Neurological:     General: No focal deficit present.     Mental Status: He is alert.     Cranial Nerves: No cranial nerve deficit, dysarthria or facial asymmetry.     Sensory: No sensory deficit.     Motor: No abnormal muscle tone or seizure activity.     Coordination: Coordination normal.  Psychiatric:        Mood and Affect: Mood normal.     ED Results / Procedures / Treatments   Labs (all labs ordered are listed, but only abnormal results are displayed) Labs Reviewed  BASIC METABOLIC PANEL - Abnormal; Notable for the following components:      Result Value   Glucose, Bld 123 (*)    All other components within normal limits  CBC  D-DIMER, QUANTITATIVE  TROPONIN I (HIGH SENSITIVITY)  TROPONIN I (HIGH SENSITIVITY)    EKG EKG  Interpretation  Date/Time:  Wednesday August 25 2022 07:55:57 EDT Ventricular Rate:  64 PR Interval:  162 QRS Duration: 102 QT Interval:  387 QTC Calculation: 400 R Axis:   51 Text Interpretation: Sinus rhythm No significant change since last tracing Confirmed by Linwood Dibbles [id number]) on 08/25/2022 7:58:16 AM  Radiology DG Chest 2 View  Result Date: 08/25/2022 CLINICAL DATA:  Chest pain. EXAM: CHEST - 2 VIEW COMPARISON:  11/15/2021. FINDINGS: Clear lungs. Normal heart size and mediastinal contours. No pleural effusion or pneumothorax. Visualized bones and upper abdomen are unremarkable. IMPRESSION: No evidence of acute cardiopulmonary disease. Electronically Signed   By: Orvan Falconer M.D.   On: 08/25/2022 08:33    Procedures Procedures    Medications Ordered in ED Medications  alum & mag hydroxide-simeth (MAALOX/MYLANTA) 200-200-20 MG/5ML suspension 30 mL (30 mLs Oral Given 08/25/22 0835)    ED Course/ Medical Decision Making/ A&P Clinical Course as of 08/25/22 1121  Wed Aug 25, 2022  0838 Troponin normal D-dimer normal.  Metabolic panel normal.  CBC normal. [JK]  0838 Chest x-ray without acute findings [JK]    Clinical Course User Index [JK] Linwood Dibbles, MD             HEART Score: 2                Medical Decision Making Problems Addressed: Pleurisy: acute illness or injury that poses a threat to life or bodily functions  Amount and/or Complexity of Data Reviewed Labs: ordered. Decision-making details documented in ED Course. Radiology: ordered and independent interpretation performed.  Risk OTC drugs. Prescription drug management.   Patient presented to the ED for evaluation of chest pain.  ED workup reassuring.  Low risk heart score.  Serial troponins are normal.  I doubt acute coronary syndrome.  D-dimer negative.  I doubt PE.  X-ray without evidence of pneumonia or pneumothorax.  Does have history of acid reflux.  Is possible that could be contributing to his  symptoms.  Will have him continue that recommend close follow-up with PCP.  Warning signs precautions discussed.        Final Clinical Impression(s) / ED Diagnoses Final diagnoses:  Pleurisy    Rx / DC Orders ED Discharge Orders          Ordered    pantoprazole (PROTONIX) 40 MG tablet  Daily        08/25/22 1120              Linwood Dibbles, MD 08/25/22 1121

## 2022-08-25 NOTE — ED Notes (Signed)
Pt given discharge instructions and reviewed prescriptions. Opportunities given for questions. Pt verbalizes understanding. PIV removed x1. Xyla Leisner R, RN 

## 2022-08-25 NOTE — ED Triage Notes (Signed)
Pt arrives to ED with c/o intermittent left sided chest pain of the past few days. He notes this morning he woke up and the CP had worsened with associated nausea, diaphoresis, and back pain.

## 2022-08-26 ENCOUNTER — Ambulatory Visit (HOSPITAL_COMMUNITY): Payer: Medicaid Other

## 2022-11-29 ENCOUNTER — Other Ambulatory Visit: Payer: Self-pay | Admitting: Internal Medicine

## 2022-11-29 NOTE — Telephone Encounter (Signed)
Prescription refill request for Eliquis received. Indication:AFIB Last office visit:5/24 Scr:0.91  6/24 Age:62  Weight:109  kg  Prescription refilled

## 2023-01-07 ENCOUNTER — Other Ambulatory Visit: Payer: Self-pay | Admitting: Internal Medicine

## 2023-01-07 NOTE — Telephone Encounter (Signed)
Prescription refill request for Eliquis received. Indication:afib Last office visit:5/24 Scr:0.91  6/24 Age: 62 Weight:109  kg  Prescription refilled

## 2023-02-08 ENCOUNTER — Other Ambulatory Visit: Payer: Self-pay | Admitting: Internal Medicine

## 2023-03-17 DIAGNOSIS — I480 Paroxysmal atrial fibrillation: Secondary | ICD-10-CM | POA: Insufficient documentation

## 2023-03-21 ENCOUNTER — Telehealth: Payer: Self-pay

## 2023-03-21 DIAGNOSIS — M47816 Spondylosis without myelopathy or radiculopathy, lumbar region: Secondary | ICD-10-CM | POA: Insufficient documentation

## 2023-03-21 NOTE — Telephone Encounter (Signed)
   Name: PINCUS BALYEAT  DOB: 1960/07/06  MRN: [id number]  Primary Cardiologist: Chrystie Nose, MD  Chart reviewed as part of pre-operative protocol coverage. Because of Dinnie Duggin Rodd's past medical history and time since last visit, he will require a follow-up telephone visit in order to better assess preoperative cardiovascular risk.  Pre-op covering staff: - Please schedule appointment and call patient to inform them. If patient already had an upcoming appointment within acceptable timeframe, please add "pre-op clearance" to the appointment notes so provider is aware. - Please contact requesting surgeon's office via preferred method (i.e, phone, fax) to inform them of need for appointment prior to surgery.   Per office protocol, patient can hold Eliquis for 3 days prior to procedure.  Please resume when medically safe to do so.    Sharlene Dory, PA-C  03/21/2023, 4:24 PM

## 2023-03-21 NOTE — Telephone Encounter (Signed)
Patient with diagnosis of A fib on Eliquis for anticoagulation.    Procedure: B/L L5-S1 TFESI  Date of procedure: 04/04/23   CHA2DS2-VASc Score = 2  This indicates a 2.2% annual risk of stroke. The patient's score is based upon: CHF History: 0 HTN History: 1 Diabetes History: 0 Stroke History: 0 Vascular Disease History: 1 Age Score: 0 Gender Score: 0   CrCl 131 ml/min Platelet count 209K  Per office protocol, patient can hold Eliquis for 3 days prior to procedure.    **This guidance is not considered finalized until pre-operative APP has relayed final recommendations.**

## 2023-03-21 NOTE — Telephone Encounter (Signed)
Patient is scheduled for telehealth appointment on 03/29/2023. Med rec and consent done.

## 2023-03-21 NOTE — Telephone Encounter (Signed)
   Pre-operative Risk Assessment    Patient Name: Vincent Summers  DOB: [birth year] MRN: [id number]   Date of last office visit: 08/05/2022  Date of next office visit: NA    Request for Surgical Clearance    Procedure:   B/L L5-S1 TFESI  Date of Surgery:  Clearance 04/04/23                                 Surgeon:  Wynonia Lawman, MD Surgeon's Group or Practice Name:  [facility]  Phone number:  [phone] Fax number:  [phone]   Type of Clearance Requested:   - Medical  - Pharmacy:  Hold Apixaban (Eliquis) 3 days prior to procedure   Type of Anesthesia:  Not Indicated   Additional requests/questions:  Urgent request  Signed, Erby Pian   03/21/2023, 1:46 PM

## 2023-03-21 NOTE — Telephone Encounter (Signed)
Patient is scheduled for telehealth appointment on 03/29/2023. Med rec and consent done.    Patient Consent for Virtual Visit        Vincent Summers has provided verbal consent on 03/21/2023 for a virtual visit (video or telephone).   CONSENT FOR VIRTUAL VISIT FOR:  Vincent Summers  By participating in this virtual visit I agree to the following:  I hereby voluntarily request, consent and authorize [facility] and its employed or contracted physicians, physician assistants, nurse practitioners or other licensed health care professionals (the Practitioner), to provide me with telemedicine health care services (the "Services") as deemed necessary by the treating Practitioner. I acknowledge and consent to receive the Services by the Practitioner via telemedicine. I understand that the telemedicine visit will involve communicating with the Practitioner through live audiovisual communication technology and the disclosure of certain medical information by electronic transmission. I acknowledge that I have been given the opportunity to request an in-person assessment or other available alternative prior to the telemedicine visit and am voluntarily participating in the telemedicine visit.  I understand that I have the right to withhold or withdraw my consent to the use of telemedicine in the course of my care at any time, without affecting my right to future care or treatment, and that the Practitioner or I may terminate the telemedicine visit at any time. I understand that I have the right to inspect all information obtained and/or recorded in the course of the telemedicine visit and may receive copies of available information for a reasonable fee.  I understand that some of the potential risks of receiving the Services via telemedicine include:  Delay or interruption in medical evaluation due to technological equipment failure or disruption; Information transmitted may not be sufficient (e.g. poor  resolution of images) to allow for appropriate medical decision making by the Practitioner; and/or  In rare instances, security protocols could fail, causing a breach of personal health information.  Furthermore, I acknowledge that it is my responsibility to provide information about my medical history, conditions and care that is complete and accurate to the best of my ability. I acknowledge that Practitioner's advice, recommendations, and/or decision may be based on factors not within their control, such as incomplete or inaccurate data provided by me or distortions of diagnostic images or specimens that may result from electronic transmissions. I understand that the practice of medicine is not an exact science and that Practitioner makes no warranties or guarantees regarding treatment outcomes. I acknowledge that a copy of this consent can be made available to me via my patient portal [facility] MyChart), or I can request a printed copy by calling the office of [facility].    I understand that my insurance will be billed for this visit.   I have read or had this consent read to me. I understand the contents of this consent, which adequately explains the benefits and risks of the Services being provided via telemedicine.  I have been provided ample opportunity to ask questions regarding this consent and the Services and have had my questions answered to my satisfaction. I give my informed consent for the services to be provided through the use of telemedicine in my medical care

## 2023-03-24 NOTE — Telephone Encounter (Signed)
 I do not see that this patient has an upcoming GI clinic appointment. I believe this was sent in error. Please update [place] if you see something different. GM

## 2023-03-28 NOTE — Telephone Encounter (Addendum)
 See notes from Dr. Meridee Score. Request sent in error. Will discuss with pre-op APP, ok to cancel tele appointment for tomorrow.

## 2023-03-28 NOTE — Telephone Encounter (Signed)
 Patient is scheduled for an epidural steroid injection with [facility], not sure how it got to GI. Please call pt to confirm that he is still having the injection on 04/04/23.

## 2023-03-28 NOTE — Telephone Encounter (Signed)
 Spoke with patient, confirmed appointment on 04/04/2023 and tele pre-op appointment for 03/29/2023.

## 2023-03-29 ENCOUNTER — Ambulatory Visit: Payer: BC Managed Care – PPO | Attending: Nurse Practitioner | Admitting: Nurse Practitioner

## 2023-03-29 ENCOUNTER — Encounter: Payer: Self-pay | Admitting: Nurse Practitioner

## 2023-03-29 DIAGNOSIS — Z01810 Encounter for preprocedural cardiovascular examination: Secondary | ICD-10-CM

## 2023-03-29 NOTE — Progress Notes (Signed)
 Virtual Visit via Telephone Note   Because of Vincent Summers's co-morbid illnesses, he is at least at moderate risk for complications without adequate follow up.  This format is felt to be most appropriate for this patient at this time.  The patient did not have access to video technology/had technical difficulties with video requiring transitioning to audio format only (telephone).  All issues noted in this document were discussed and addressed.  No physical exam could be performed with this format.  Please refer to the patient's chart for his consent to telehealth for [facility].  Evaluation Performed:  Preoperative cardiovascular risk assessment _____________   Date:  03/29/2023   Patient ID:  Vincent Summers, DOB Jan 17, 1961, MRN [id number] Patient Location:  Home Provider location:   Office  Primary Care Provider:  Sophronia Ozell BROCKS, MD Primary Cardiologist:  Vinie BROCKS Maxcy, MD  Chief Complaint / Patient Profile   63 y.o. y/o male with a h/o family history CAD, essentially normal coronary arteries on cath in 2007, coronary calcium  score of 49.4 on CT 09/2019, palpitations, PAF on chronic anticoagulation, hypertension, hyperlipidemia who is pending B/L L5-S1 TFESI with Dr. Waylon on 04/04/23 and presents today for telephonic preoperative cardiovascular risk assessment.  History of Present Illness    Vincent Summers is a 63 y.o. male who presents via audio/video conferencing for a telehealth visit today.  Pt was last seen in cardiology clinic on 08/02/22 by Dr. Maxcy.  At that time Vincent Summers was doing well.  The patient is now pending procedure as outlined above. Since his last visit, he denies chest pain, shortness of breath, lower extremity edema, fatigue, melena, hematuria, hemoptysis, diaphoresis, weakness, presyncope, syncope, orthopnea, and PND. He reports occasional palpitations that last for about 5 seconds and then resolve. He has been doing cardio and strength  training for 2 hours per day a few days per week at the [employer] as part of his employment without concerning cardiac symptoms.   Past Medical History    Past Medical History:  Diagnosis Date   Anxiety    Arthritis    Atrial fibrillation (HCC)    GERD (gastroesophageal reflux disease)    History of hiatal hernia    Hypercholesteremia    Hypertension    NAFL (nonalcoholic fatty liver)    Palpitations    Rosacea    Spinal stenosis at L4-L5 level    Past Surgical History:  Procedure Laterality Date   APPENDECTOMY     CARDIAC CATHETERIZATION  2007   Dr. Cherrie   INSERTION OF MESH N/A 11/28/2018   Procedure: Insertion Of Mesh;  Surgeon: Belinda Cough, MD;  Location: [facility] OR;  Service: General;  Laterality: N/A;   KNEE SURGERY     SHOULDER SURGERY     TONSILLECTOMY     VENTRAL HERNIA REPAIR N/A 11/28/2018   Procedure: LAPAROSCOPIC VENTRAL HERNIA REPAIR;  Surgeon: Belinda Cough, MD;  Location: [facility] OR;  Service: General;  Laterality: N/A;    Allergies  Allergies  Allergen Reactions   Fenofibrate  Other (See Comments)    Back pain Back pain Back pain Back pain     Home Medications    Prior to Admission medications   Medication Sig Start Date End Date Taking? Authorizing Provider  acetaminophen  (TYLENOL ) 500 MG tablet Take 1,000 mg by mouth every 6 (six) hours as needed for moderate pain or headache.    [provider]  ALPRAZolam  (XANAX ) 0.5 MG tablet Take 0.5  mg by mouth at bedtime as needed for anxiety. TAKE 3 TIMES DAILY PRN    [provider]  apixaban  (ELIQUIS ) 5 MG TABS tablet Take 1 tablet (5 mg total) by mouth 2 (two) times daily. 03/29/22   Hilty, Vinie BROCKS, MD  atorvastatin  (LIPITOR) 20 MG tablet TAKE 1 TABLET BY MOUTH EVERY DAY 11/10/21   Hilty, Vinie BROCKS, MD  benazepril  (LOTENSIN ) 40 MG tablet Take 40 mg by mouth daily at 12 noon.    [provider]  gabapentin  (NEURONTIN ) 300 MG capsule Take 300 mg by mouth 2 (two) times daily.     [provider]  hydrochlorothiazide  (HYDRODIURIL ) 25 MG tablet Take 25 mg by mouth daily. 07/04/17   [provider]  metoprolol  succinate (TOPROL -XL) 50 MG 24 hr tablet TAKE 1 TABLET BY MOUTH EVERY DAY 02/10/23   Hilty, Vinie BROCKS, MD  minocycline  (MINOCIN ,DYNACIN ) 100 MG capsule Take 1 capsule (100 mg total) by mouth 2 (two) times daily. 04/04/15   Duanne Butler DASEN, MD  pantoprazole  (PROTONIX ) 40 MG tablet Take 1 tablet (40 mg total) by mouth daily. 08/25/22   Randol Simmonds, MD  rizatriptan (MAXALT) 10 MG tablet TAKE 1 TABLET BY MOUTH ONCE AS NEEDED FOR MIGRAINE FOR UP TO 1 DOSE. MAY REPEAT IN 2 HOURS IF NEEDED Patient not taking: Reported on 03/21/2023 02/19/20   [provider]  sildenafil (REVATIO) 20 MG tablet Take 20-60 mg by mouth daily as needed (ED).    [provider]    Physical Exam    Vital Signs:  Vincent Summers does not have vital signs available for review today.  Given telephonic nature of communication, physical exam is limited. AAOx3. NAD. Normal affect.  Speech and respirations are unlabored.  Accessory Clinical Findings    None  Assessment & Plan    1.  Preoperative Cardiovascular Risk Assessment: According to the Revised Cardiac Risk Index (RCRI), his Perioperative Risk of Major Cardiac Event is (%): 0.4. His Functional Capacity in METs is: 7.25 according to the Duke Activity Status Index (DASI). The patient is doing well from a cardiac perspective. Therefore, based on ACC/AHA guidelines, the patient would be at acceptable risk for the planned procedure without further cardiovascular testing.   The patient was advised that if he develops new symptoms prior to surgery to contact our office to arrange for a follow-up visit, and he verbalized understanding.  Per office protocol, patient can hold Eliquis  for 3 days prior to procedure. Please resume when medically safe to do so.   A copy of this note will be routed to requesting  surgeon.  Time:   Today, I have spent 10 minutes with the patient with telehealth technology discussing medical history, symptoms, and management plan.    [name], NP-C  03/29/2023, 3:25 PM [address] Office [phone] Fax [phone]

## 2023-06-07 ENCOUNTER — Encounter: Payer: Self-pay | Admitting: Internal Medicine

## 2023-06-15 NOTE — Telephone Encounter (Signed)
 Please notify office that we will need additional requests for med hold for injections. We completed clearance for injection on 04/04/2023.

## 2023-06-17 ENCOUNTER — Telehealth: Payer: Self-pay | Admitting: *Deleted

## 2023-06-17 NOTE — Telephone Encounter (Signed)
   Pre-operative Risk Assessment    Patient Name: Vincent Summers  DOB: 1960/10/30 MRN: [id number]   Date of last office visit: 08/05/2022 Had tele in January.  Date of next office visit: None  Request for Surgical Clearance    Procedure:   TFESI Injection  Date of Surgery:  Clearance TBD                                 Surgeon:  Dr. Wynonia Lawman Surgeon's Group or Practice Name:  [facility] Phone number:  [phone] Fax number:  [phone]   Type of Clearance Requested:   - Medical  - Pharmacy:  Hold Apixaban (Eliquis) 3 days prior.   Type of Anesthesia:  Not Indicated   Additional requests/questions:    Signed, Daschel Roughton   06/17/2023, 12:43 PM

## 2023-06-20 NOTE — Telephone Encounter (Signed)
 Patient with diagnosis of afib on Eliquis for anticoagulation.    Procedure: TFESI Injection  Date of procedure: TBD   CHA2DS2-VASc Score = 2   This indicates a 2.2% annual risk of stroke. The patient's score is based upon: CHF History: 0 HTN History: 1 Diabetes History: 0 Stroke History: 0 Vascular Disease History: 1 Age Score: 0 Gender Score: 0      CrCl 110 ml/min Platelet count 209  Per office protocol, patient can hold Eliquis for 3 days prior to procedure.    **This guidance is not considered finalized until pre-operative APP has relayed final recommendations.**

## 2023-06-21 NOTE — Telephone Encounter (Signed)
   Name: Vincent Summers  DOB: 1961-03-12  MRN: [id number]  Primary Cardiologist: Chrystie Nose, MD  Chart reviewed as part of pre-operative protocol coverage. Because of Vincent Summers's past medical history and time since last visit, he will require a follow-up in-office visit in order to better assess preoperative cardiovascular risk.  Pre-op covering staff: - Please schedule appointment and call patient to inform them. If patient already had an upcoming appointment within acceptable timeframe, please add "pre-op clearance" to the appointment notes so provider is aware. - Please contact requesting surgeon's office via preferred method (i.e, phone, fax) to inform them of need for appointment prior to surgery.   Per office protocol, patient can hold Eliquis for 3 days prior to procedure.  Please resume when medically safe to do so  Sharlene Dory, PA-C  06/21/2023, 7:52 AM

## 2023-06-21 NOTE — Progress Notes (Unsigned)
 Cardiology Clinic Note   Patient Name: Vincent Summers Date of Encounter: 06/24/2023  Primary Care Provider:  Eartha Inch, MD Primary Cardiologist:  Chrystie Nose, MD  Patient Profile    Vincent Summers 63 year old male presents the clinic today for follow-up evaluation of his hypertension, hyperlipidemia, preoperative cardiac evaluation, and chest pain.  Past Medical History    Past Medical History:  Diagnosis Date   Anxiety    Arthritis    Atrial fibrillation (HCC)    GERD (gastroesophageal reflux disease)    History of hiatal hernia    Hypercholesteremia    Hypertension    NAFL (nonalcoholic fatty liver)    Palpitations    Rosacea    Spinal stenosis at L4-L5 level    Past Surgical History:  Procedure Laterality Date   APPENDECTOMY     CARDIAC CATHETERIZATION  2007   Dr. Gala Romney   INSERTION OF MESH N/A 11/28/2018   Procedure: Insertion Of Mesh;  Surgeon: Manus Rudd, MD;  Location: [facility] OR;  Service: General;  Laterality: N/A;   KNEE SURGERY     SHOULDER SURGERY     TONSILLECTOMY     VENTRAL HERNIA REPAIR N/A 11/28/2018   Procedure: LAPAROSCOPIC VENTRAL HERNIA REPAIR;  Surgeon: Manus Rudd, MD;  Location: [facility] OR;  Service: General;  Laterality: N/A;    Allergies  Allergies  Allergen Reactions   Fenofibrate Other (See Comments)    Back pain Back pain Back pain Back pain     History of Present Illness    Vincent Summers has a PMH of paroxysmal atrial fibrillation, palpitations, chest pain and hyperlipidemia.  He was seen in follow-up by Dr. Rennis Golden on 03/24/2021.  During that time he was doing well.  He reported an episode of left-sided chest discomfort while at work.  He described the sensation as dull and nonradiating.  It would last for about 5 minutes - 10 minutes during breaks at work.  He underwent coronary angiogram April 2022 which showed minimal coronary disease.  He denied increased chest discomfort or shortness of breath with exertion.  He  remained physically active at work and was walking 7-9 miles per day.  He presented to the emergency department 11/15/2021.  He reported concerns for chest discomfort and palpitations that started 4 days prior.  He noted chest tightness.  He reported emesis.  His EKG showed normal sinus rhythm 11/12/2021.  His EKG 11/15/2021 showed atrial fibrillation.  CHA2DS2-VASc score 2.  It was felt that he was having an episode of paroxysmal atrial fibrillation.  He was started on apixaban.  He was also given metoprolol 25 mg as needed for palpitations.  He was instructed to avoid caffeine and alcohol.  Increased hydration was discussed.  He was instructed to follow-up with cardiology.  He presented to the clinic 11/18/21 for follow-up evaluation and stated he felt well . He reported that he had several episodes of irregular heartbeat at the end of last week and into the weekend.  He reported that initially he noticed irregular heartbeat for 2 days and presented to the emergency department.  He was diagnosed with precordial pain and then return to the emergency department later and was noted to be in atrial fibrillation.  He was started on apixaban and metoprolol.  We reviewed his CHA2DS2-VASc score.  He had no further episodes of sustained irregular heart rate.  We reviewed bleeding precautions and triggers for atrial fibrillation.  He expressed understanding.  We will planned  follow-up in 3 to 4 months.  He was seen in follow-up by Dr. Rennis Golden on 08/05/2022.  He continued to be stable from a cardiac standpoint at that time.  He reported less palpitations with increased beta-blocker therapy.  It was felt that he was having infrequent atrial fibrillation.  His CHA2DS2-VASc score was noted to be 2.  He was compliant with apixaban.  He denied bleeding issues.  His blood pressure was well-controlled.  His EKG showed sinus bradycardia 58 BPM.  Follow-up was planned annually and as needed.  He presents to the clinic today for  follow-up evaluation and preoperative cardiac evaluation.  He states he continues to do well.  He does note intermittent episodes of palpitations that are brief.  He occasionally notices episodes of extended palpitations that last for less than 30 minutes.  He feels that his metoprolol is working well.  He is having some back pain and comes in today for preoperative cardiac evaluation for this.  He also reports a an episode recently.  He notes that the chest discomfort lasted for about 2 hours.  It was in the setting of stress.  He exercises routinely and does not note exertional chest discomfort.  He had recent lab work done with his PCP I will request this for evaluation of his cholesterol as well.  Will plan follow-up in 12 months.  Today he denies chest pain, shortness of breath, lower extremity edema, fatigue, palpitations, melena, hematuria, hemoptysis, diaphoresis, weakness, presyncope, syncope, orthopnea, and PND.    Home Medications    Prior to Admission medications   Medication Sig Start Date End Date Taking? Authorizing Provider  acetaminophen (TYLENOL) 500 MG tablet Take 1,000 mg by mouth every 6 (six) hours as needed for moderate pain or headache.    [provider]  ALPRAZolam Prudy Feeler) 0.5 MG tablet Take 0.5 mg by mouth at bedtime as needed for anxiety. TAKE 3 TIMES DAILY PRN    [provider]  apixaban (ELIQUIS) 5 MG TABS tablet Take 1 tablet (5 mg total) by mouth 2 (two) times daily. 11/15/21 12/15/21  Terald Sleeper, MD  atorvastatin (LIPITOR) 20 MG tablet TAKE 1 TABLET BY MOUTH EVERY DAY 11/10/21   Hilty, Lisette Abu, MD  benazepril (LOTENSIN) 40 MG tablet Take 40 mg by mouth daily at 12 noon.    [provider]  erythromycin ophthalmic ointment Place a 1/2 inch ribbon of ointment into the lower eyelid left eye twice daily for 7 days. 08/26/21   Raspet, Noberto Retort, PA-C  esomeprazole (NEXIUM) 40 MG capsule Take 40 mg by mouth daily at 12 noon.    [provider]  gabapentin (NEURONTIN) 300 MG capsule Take 300 mg by mouth 2 (two) times daily.    [provider]  hydrochlorothiazide (HYDRODIURIL) 25 MG tablet Take 1 tablet (25 mg total) by mouth daily. Patient taking differently: Take 25 mg by mouth daily at 12 noon. 04/15/17   Donita Brooks, MD  metoprolol succinate (TOPROL-XL) 25 MG 24 hr tablet TAKE 1 TABLET BY MOUTH EVERY DAY 08/10/21   Hilty, Lisette Abu, MD  metoprolol tartrate (LOPRESSOR) 25 MG tablet Take 1 tablet (25 mg total) by mouth 2 (two) times daily as needed for up to 30 doses (For active palpitations/ A Fib). 11/15/21   Terald Sleeper, MD  metoprolol tartrate (LOPRESSOR) 50 MG tablet Take 1 tablet by mouth once for procedure. 06/22/19   Hilty, Lisette Abu, MD  minocycline (MINOCIN,DYNACIN) 100 MG capsule Take  1 capsule (100 mg total) by mouth 2 (two) times daily. 04/04/15   Donita Brooks, MD  rizatriptan (MAXALT) 10 MG tablet TAKE 1 TABLET BY MOUTH ONCE AS NEEDED FOR MIGRAINE FOR UP TO 1 DOSE. MAY REPEAT IN 2 HOURS IF NEEDED 02/19/20   [provider]  sildenafil (REVATIO) 20 MG tablet Take 20-60 mg by mouth daily as needed (ED).    [provider]  traMADol (ULTRAM) 50 MG tablet Take 50 mg by mouth every 6 (six) hours as needed.    [provider]  trimethoprim-polymyxin b (POLYTRIM) ophthalmic solution Place 1 drop into both eyes every 6 (six) hours. 04/29/19   Wieters, Junius Creamer, PA-C    Family History    Family History  Problem Relation Age of Onset   Emphysema Mother    Skin cancer Mother    Leukemia Father    Heart disease Father 42       CAD---CABG @ age 39   Arrhythmia Father    Arrhythmia Sister    Stroke Maternal Grandmother 24   Heart disease Maternal Grandfather    Stroke Maternal Grandfather 25   Hypertension Paternal Grandmother    Stroke Paternal Grandmother 24   Heart disease Paternal Grandfather 41   He indicated that his mother is alive. He indicated that his  father is deceased. He indicated that both of his sisters are alive. He indicated that his brother is alive. He indicated that the status of his maternal grandmother is unknown. He indicated that the status of his maternal grandfather is unknown. He indicated that the status of his paternal grandmother is unknown. He indicated that the status of his paternal grandfather is unknown.  Social History    Social History   Socioeconomic History   Marital status: Married    Spouse name: Not on file   Number of children: Not on file   Years of education: Not on file   Highest education level: Not on file  Occupational History   Not on file  Tobacco Use   Smoking status: Never   Smokeless tobacco: Never  Vaping Use   Vaping status: Never Used  Substance and Sexual Activity   Alcohol use: Yes    Alcohol/week: 14.0 standard drinks of alcohol    Types: 14 Cans of beer per week    Comment: Daily   Drug use: No   Sexual activity: Yes  Other Topics Concern   Not on file  Social History Narrative   Entered 11/2013:      Works as a Investment banker, 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   Does not smoke.   Drinks 2 beers each evening. Drinks about the same amount on the weekends. No wine or liquor.      Married with 4 children-- all of them are in the house.   He has a 23 year old son he is going [employer] and then should be moving out of the house soon.   Has an 62 year old and he has 62 year old twins.   Social Drivers of Health   Financial Resource Strain: Medium Risk (04/25/2023)   Received from [facility]   Overall Financial Resource Strain (CARDIA)    Difficulty of Paying Living Expenses: Somewhat hard  Food Insecurity: No Food Insecurity (04/25/2023)   Received from [facility]   Hunger Vital Sign    Worried About Running Out of Food in the Last Year: Never true    Ran Out of Food in the Last Year:  Never true  Transportation Needs: No Transportation Needs (04/25/2023)    Received from [facility] - Transportation    Lack of Transportation (Medical): No    Lack of Transportation (Non-Medical): No  Physical Activity: Sufficiently Active (04/25/2023)   Received from [facility]   Exercise Vital Sign    Days of Exercise per Week: 6 days    Minutes of Exercise per Session: 30 min  Stress: No Stress Concern Present (04/25/2023)   Received from [facility] - Occupational Stress Questionnaire    Feeling of Stress : Only a little  Social Connections: Socially Integrated (04/25/2023)   Received from [facility]   Social Network    How would you rate your social network (family, work, friends)?: Good participation with social networks  Intimate Partner Violence: Not At Risk (04/25/2023)   Received from [facility]   HITS    Over the last 12 months how often did your partner physically hurt you?: Never    Over the last 12 months how often did your partner insult you or talk down to you?: Never    Over the last 12 months how often did your partner threaten you with physical harm?: Never    Over the last 12 months how often did your partner scream or curse at you?: Never     Review of Systems    General:  No chills, fever, night sweats or weight changes.  Cardiovascular:  No chest pain, dyspnea on exertion, edema, orthopnea, palpitations, paroxysmal nocturnal dyspnea. Dermatological: No rash, lesions/masses Respiratory: No cough, dyspnea Urologic: No hematuria, dysuria Abdominal:   No nausea, vomiting, diarrhea, bright red blood per rectum, melena, or hematemesis Neurologic:  No visual changes, wkns, changes in mental status. All other systems reviewed and are otherwise negative except as noted above.  Physical Exam    VS:  BP 136/76 (BP Location: Left Arm, Patient Position: Sitting, Cuff Size: Large)   Pulse 63   Ht 6\' 1"  (1.854 m)   Wt 241 lb (109.3 kg)   SpO2 97%   BMI 31.80 kg/m  , BMI Body  mass index is 31.8 kg/m. GEN: Well nourished, well developed, in no acute distress. HEENT: normal. Neck: Supple, no JVD, carotid bruits, or masses. Cardiac: RRR, no murmurs, rubs, or gallops. No clubbing, cyanosis, edema.  Radials/DP/PT 2+ and equal bilaterally.  Respiratory:  Respirations regular and unlabored, clear to auscultation bilaterally. GI: Soft, nontender, nondistended, BS + x 4. MS: no deformity or atrophy. Skin: warm and dry, no rash. Neuro:  Strength and sensation are intact. Psych: Normal affect.  Accessory Clinical Findings    Recent Labs: 08/25/2022: BUN 13; Creatinine, Ser 0.91; Hemoglobin 15.0; Platelets 209; Potassium 3.7; Sodium 136   Recent Lipid Panel    Component Value Date/Time   CHOL 181 02/12/2015 1300   TRIG 416 (H) 02/12/2015 1300   HDL 33 (L) 02/12/2015 1300   CHOLHDL 5.5 (H) 02/12/2015 1300   VLDL NOT CALC 02/12/2015 1300   LDLCALC NOT CALC 02/12/2015 1300         ECG personally reviewed by me today-EKG Interpretation Date/Time:  Friday June 24 2023 09:11:22 EDT Ventricular Rate:  63 PR Interval:  160 QRS Duration:  96 QT Interval:  400 QTC Calculation: 409 R Axis:   34  Text Interpretation: Normal sinus rhythm Normal ECG When compared with ECG of 25-Aug-2022 07:55, PREVIOUS ECG IS PRESENT Confirmed by Edd Fabian [phone]) on 06/24/2023  9:18:00 AM   EKG 11/18/2021 normal sinus rhythm no ectopy 68 bpm- No acute changes  Coronary CTA 07/16/2019   FINDINGS: Non-cardiac: See separate report from [facility]. No significant findings on limited lung and soft tissue windows.   Calcium Score: Calcium noted in LM/LAD and diagonal system   Coronary Arteries: Right dominant with no anomalies   LM: 1-24% calcific ostial stenosis   LAD: 1-24% proximal and mid calcific stenosis   D1: 1-24% calcific stenosis   D2: 1-24% calcific stenosis   Circumflex: Normal   OM1: Normal   OM2: Normal   RCA: Normal   PDA: Normal   PLA:  Normal   IMPRESSION: 1. Calcium score 49.4 which is 65 th percentile for age and sex   2.  Normal aortic root diameter 3.5 cm   3.  CAD RADS 1 non obstructive CAD see description above   Charlton Haws   Electronically Signed: By: Charlton Haws M.D. On: 07/16/2019 13:57  Echocardiogram 5/07 LEFT VENTRICLE:   -  Left ventricular size was normal.   -  Overall left ventricular systolic function was normal.   -  Left ventricular ejection fraction was estimated , range being 60         % to 65 %.   -  There was no diagnostic evidence of left ventricular regional         wall motion abnormalities.   -  Left ventricular wall thickness was mildly increased.    Doppler interpretation(s):   -  There was a normal transmitral flow pattern.   AORTIC VALVE:   -  The aortic valve was trileaflet.   -  There was normal aortic valve leaflet excursion.    Doppler interpretation(s):   -  Transaortic velocity was within the normal range.   -  There was no significant aortic valvular regurgitation.   AORTA:   -  There was mild aortic root dilatation (43 mm). Ascending aorta         measures 3.1 mm.   MITRAL VALVE:   -  Mitral valve structure was normal.    Doppler interpretation(s):   -  There was trivial mitral valvular regurgitation.   LEFT ATRIUM:   -  The left atrium was mildly dilated.   RIGHT VENTRICLE:   -  Right ventricular size was normal.   -  Right ventricular systolic function was normal.   TRICUSPID VALVE:   -  The tricuspid valve structure was normal.    Doppler interpretation(s):   -  There was trivial tricuspid valvular regurgitation.   RIGHT ATRIUM:   -  Right atrial size was normal.   PERICARDIUM:   -  There was no pericardial effusion.    ---------------------------------------------------------------   SUMMARY   -  Overall left ventricular systolic function was normal. Left         ventricular ejection fraction was estimated , range being 60         % to 65 %.  There was no diagnostic evidence of left         ventricular regional wall motion abnormalities. Left         ventricular wall thickness was mildly increased.   -  There was mild aortic root dilatation (43 mm). Ascending aorta         measures 3.1 mm.   -  The left atrium was mildly dilated.   -  There was no pericardial effusion.    Assessment &  Plan   1.  Paroxysmal atrial fibrillation-denies recent episodes of accelerated or irregular heartbeat.  He reports compliance with apixaban.  Denies recent injury or bleeding issues.  CHA2DS2-VASc score 2. Continue metoprolol, apixaban Continue heart healthy diet Maintain physical activity Avoid triggers caffeine, chocolate, EtOH, dehydration etc.-reviewed  Chest discomfort-denies anginal type symptoms.  Denies exertional chest discomfort.  Had a coronary CTA 07/16/2019 which showed mild nonobstructive CAD. Continue metoprolol No plans for ischemic evaluation  Hyperlipidemia-will request recent labs. Continue atorvastatin High-fiber diet Increase physical activity as tolerated Follows with PCP  Preoperative cardiac evaluation-Procedure:   TFESI Injection   Date of Surgery:  Clearance TBD                                  Surgeon:  Dr. Wynonia Lawman Surgeon's Group or Practice Name:  [facility] Phone number:  [phone] Fax number:  [phone]    Primary Cardiologist: Chrystie Nose, MD  Chart reviewed as part of pre-operative protocol coverage. Given past medical history and time since last visit, based on ACC/AHA guidelines, Vincent Summers would be at acceptable risk for the planned procedure without further cardiovascular testing.   Patient was advised that if he develops new symptoms prior to surgery to contact our office to arrange a follow-up appointment.  He verbalized understanding.  Per office protocol, patient can hold Eliquis for 3 days prior to procedure.    I will route this recommendation  to the requesting party via Epic fax function and remove from pre-op pool.       Disposition: Follow-up with Dr. Rennis Golden  or me in 12 months.   Thomasene Ripple. Emry Tobin NP-C     06/24/2023, 9:28 AM [facility] [address] Office [phone] Fax [phone]  Notice: This dictation was prepared with Dragon dictation along with smaller phrase technology. Any transcriptional errors that result from this process are unintentional and may not be corrected upon review.  I spent 14 minutes examining this patient, reviewing medications, and using patient centered shared decision making involving her cardiac care.   I spent  20 minutes reviewing  past medical history,  medications, and prior cardiac tests.

## 2023-06-21 NOTE — Telephone Encounter (Signed)
 Per Vincent Summers patient HOLD blood thinner starting 06-24-23 on the day of office visit with Edd Fabian and hold until after procedure. Patient aware and verbalized understanding.

## 2023-06-24 ENCOUNTER — Encounter: Payer: Self-pay | Admitting: General Practice

## 2023-06-24 ENCOUNTER — Ambulatory Visit: Attending: General Practice | Admitting: General Practice

## 2023-06-24 VITALS — BP 136/76 | HR 63 | Ht 73.0 in | Wt 241.0 lb

## 2023-06-24 DIAGNOSIS — Z01810 Encounter for preprocedural cardiovascular examination: Secondary | ICD-10-CM

## 2023-06-24 DIAGNOSIS — E782 Mixed hyperlipidemia: Secondary | ICD-10-CM

## 2023-06-24 DIAGNOSIS — I480 Paroxysmal atrial fibrillation: Secondary | ICD-10-CM

## 2023-06-24 DIAGNOSIS — R079 Chest pain, unspecified: Secondary | ICD-10-CM

## 2023-06-24 MED ORDER — METOPROLOL SUCCINATE ER 50 MG PO TB24: 50.0000 mg | ORAL_TABLET | Freq: Every day | ORAL | 1 refills | Status: DC

## 2023-06-24 NOTE — Patient Instructions (Signed)
 Medication Instructions:  FOR EXTENDED PALPITATIONS SIT DOWN AND REST AND DRINK AT LEAST 8 OUNCES OF WATER.  FOR SUSTAINED PALPITATIONS MAY TAKE EXTRA 1/2 DOSE OF YOUR METOPROLOL-25MG . *If you need a refill on your cardiac medications before your next appointment, please call your pharmacy*  Lab Work: NONE If you have labs (blood work) drawn today and your tests are completely normal, you will receive your results only by:  MyChart Message (if you have MyChart) OR  A paper copy in the mail If you have any lab test that is abnormal or we need to change your treatment, we will call you to review the results.  Testing/Procedures: NONE  Follow-Up: At [facility], you and your health needs are our priority.  As part of our continuing mission to provide you with exceptional heart care, our providers are all part of one team.  This team includes your primary Cardiologist (physician) and Advanced Practice Providers or APPs (Physician Assistants and Nurse Practitioners) who all work together to provide you with the care you need, when you need it.  Your next appointment:   12 month(s)  Provider:   Chrystie Nose, MD    Other Instructions OK FOR INJECTION-HOLD Everlene Balls 3 DAYS BEFORE INJECTION THEY WILL TELL YOU WHEN TO RE-START      1st Floor: - Lobby - Registration  - Pharmacy  - Lab - Cafe  2nd Floor: - PV Lab - Diagnostic Testing (echo, CT, nuclear med)  3rd Floor: - Vacant  4th Floor: - TCTS (cardiothoracic surgery) - AFib Clinic - Structural Heart Clinic - Vascular Surgery  - Vascular Ultrasound  5th Floor: - HeartCare Cardiology (general and EP) - Clinical Pharmacy for coumadin, hypertension, lipid, weight-loss medications, and med management appointments    Valet parking services will be available as well.

## 2023-07-07 ENCOUNTER — Other Ambulatory Visit: Payer: Self-pay | Admitting: Internal Medicine

## 2023-07-07 NOTE — Telephone Encounter (Signed)
 Prescription refill request for Eliquis received. Indication: PAF Last office visit: 06/24/23  Annice Barthel NP Scr: 0.91 on 08/25/22  Epic Age: 63 Weight: 109.3kg  Based on above findings Eliquis 5mg  twice daily is the appropriate dose.  Refill approved.

## 2023-07-20 ENCOUNTER — Encounter (HOSPITAL_BASED_OUTPATIENT_CLINIC_OR_DEPARTMENT_OTHER): Payer: Self-pay | Admitting: Internal Medicine

## 2023-07-20 NOTE — Telephone Encounter (Signed)
 Chart reviewed. Clinically stable at 4/4 OV.  Per discussion with pharmD, she reports it is OK hold 24 hours, up to 48 if he really needs to.  No SBE ppx needs identified.  I called patient but it just rings incessantly. Home number not in service. Will relay by [employer].

## 2023-09-01 ENCOUNTER — Encounter (HOSPITAL_BASED_OUTPATIENT_CLINIC_OR_DEPARTMENT_OTHER): Payer: Self-pay | Admitting: Internal Medicine

## 2023-11-08 ENCOUNTER — Encounter: Payer: Self-pay | Admitting: Internal Medicine

## 2023-12-08 ENCOUNTER — Telehealth: Payer: Self-pay

## 2023-12-08 NOTE — Telephone Encounter (Signed)
   Pre-operative Risk Assessment    Patient Name: Vincent Summers  DOB: September 16, 1960 MRN: [id number]   Date of last office visit: 06/24/23 JOSEFA BEAUVAIS, NP Date of next office visit: NONE  Request for Surgical Clearance    Procedure:  EXCISIONAL BIOPSY OF LESION ON THE BUCCAL MUCOSA   Date of Surgery:  Clearance TBD                                Surgeon:  OZELL HURDLE, DDS, MD Surgeon's Group or Practice Name:  [facility] Phone number:  [phone] Fax number:  [phone]   Type of Clearance Requested:   - Medical  - Pharmacy:  Hold Apixaban  (Eliquis )     Type of Anesthesia:  Local    Additional requests/questions:    SignedLucie DELENA Ku   12/08/2023, 9:34 AM

## 2023-12-13 ENCOUNTER — Telehealth: Payer: Self-pay

## 2023-12-13 NOTE — Telephone Encounter (Signed)
 Preop tele appt now scheduled, med rec and consent done.

## 2023-12-13 NOTE — Telephone Encounter (Signed)
 Patient with diagnosis of afib on Eliquis  for anticoagulation.    Procedure: EXCISIONAL BIOPSY OF LESION ON THE BUCCAL MUCOSA  Date of procedure: TBD   CHA2DS2-VASc Score = 2   This indicates a 2.2% annual risk of stroke. The patient's score is based upon: CHF History: 0 HTN History: 1 Diabetes History: 0 Stroke History: 0 Vascular Disease History: 1 Age Score: 0 Gender Score: 0      CrCl 119 ml/min Platelet count 188  Patient has not had an Afib/aflutter ablation or Watchman within the last 3 months or DCCV within the last 30 days   Patient does not require pre-op antibiotics for dental procedure.  Per office protocol, patient can hold Eliquis  for 1 day prior to procedure.    **This guidance is not considered finalized until pre-operative APP has relayed final recommendations.**

## 2023-12-13 NOTE — Telephone Encounter (Signed)
   Name: Vincent Summers  DOB: 13-Jan-1961  MRN: [id number]  Primary Cardiologist: Vinie JAYSON Maxcy, MD   Preoperative team, please contact this patient and set up a phone call appointment for further preoperative risk assessment. Please obtain consent and complete medication review. Thank you for your help.  I confirm that guidance regarding antiplatelet and oral anticoagulation therapy has been completed and, if necessary, noted below: Per office protocol, patient can hold Eliquis  for 1 day prior to procedure.  Patient does not require pre-op antibiotics for dental procedure  I also confirmed the patient resides in the state of [place] . As per [facility] Medical Board telemedicine laws, the patient must reside in the state in which the provider is licensed.   Lailana Shira E Garald Rhew, PA-C 12/13/2023, 8:09 AM [facility]

## 2023-12-18 ENCOUNTER — Other Ambulatory Visit: Payer: Self-pay | Admitting: Internal Medicine

## 2023-12-19 NOTE — Telephone Encounter (Signed)
 Prescription refill request for Eliquis  received. Indication: PAF Last office visit: 06/24/23  JINNY Beauvais NP Scr: 08/25/22  0.91  Epic Age: 63 Weight: 109.3kg  Based on above findings Eliquis  5mg  twice daily is the appropriate dose.  Refill approved.

## 2023-12-20 ENCOUNTER — Encounter: Payer: Self-pay | Admitting: Nurse Practitioner

## 2023-12-20 ENCOUNTER — Ambulatory Visit: Attending: Cardiovascular Disease | Admitting: Nurse Practitioner

## 2023-12-20 DIAGNOSIS — Z01810 Encounter for preprocedural cardiovascular examination: Secondary | ICD-10-CM

## 2023-12-20 NOTE — Progress Notes (Signed)
 Virtual Visit via Telephone Note   Because of Vincent Summers co-morbid illnesses, he is at least at moderate risk for complications without adequate follow up.  This format is felt to be most appropriate for this patient at this time.  Due to technical limitations with video connection Web designer), today's appointment will be conducted as an audio only telehealth visit, and [name] verbally agreed to proceed in this manner.   All issues noted in this document were discussed and addressed.  No physical exam could be performed with this format.  Evaluation Performed:  Preoperative cardiovascular risk assessment _____________   Date:  12/20/2023   Patient ID:  Vincent Summers, DOB Feb 14, 1961, MRN [id number] Patient Location:  Home Provider location:   Office  Primary Care Provider:  Sophronia Ozell BROCKS, MD Primary Cardiologist:  Vinie BROCKS Maxcy, MD  Chief Complaint / Patient Profile   63 y.o. y/o male with a h/o hypertension, hyperlipidemia, nonobstructive CAD, PAF on chronic anticoagulation who is pending excisional biopsy of lesion on buccal mucosa and presents today for telephonic preoperative cardiovascular risk assessment.  History of Present Illness    Vincent Summers is a 63 y.o. male who presents via audio/video conferencing for a telehealth visit today.  Pt was last seen in cardiology clinic on 06/24/23 by Josefa Beauvais, NP.  At that time Vincent Summers was doing well.  The patient is now pending procedure as outlined above. Since his last visit, he  denies chest pain, shortness of breath, lower extremity edema, fatigue, palpitations, melena, hematuria, hemoptysis, diaphoresis, weakness, presyncope, syncope, orthopnea, and PND. He is somewhat limited by back pain but is able to achieve > 4 METS activity without concerning cardiac symptoms.   Past Medical History    Past Medical History:  Diagnosis Date   Anxiety    Arthritis    Atrial fibrillation (HCC)    GERD  (gastroesophageal reflux disease)    History of hiatal hernia    Hypercholesteremia    Hypertension    NAFL (nonalcoholic fatty liver)    Palpitations    Rosacea    Spinal stenosis at L4-L5 level    Past Surgical History:  Procedure Laterality Date   APPENDECTOMY     CARDIAC CATHETERIZATION  2007   Dr. Cherrie   INSERTION OF MESH N/A 11/28/2018   Procedure: Insertion Of Mesh;  Surgeon: Belinda Cough, MD;  Location: [facility] OR;  Service: General;  Laterality: N/A;   KNEE SURGERY     SHOULDER SURGERY     TONSILLECTOMY     VENTRAL HERNIA REPAIR N/A 11/28/2018   Procedure: LAPAROSCOPIC VENTRAL HERNIA REPAIR;  Surgeon: Belinda Cough, MD;  Location: [facility] OR;  Service: General;  Laterality: N/A;    Allergies  Allergies  Allergen Reactions   Fenofibrate  Other (See Comments)    Back pain Back pain Back pain Back pain     Home Medications    Prior to Admission medications   Medication Sig Start Date End Date Taking? Authorizing Provider  acetaminophen  (TYLENOL ) 500 MG tablet Take 1,000 mg by mouth every 6 (six) hours as needed for moderate pain or headache.    [provider]  ALPRAZolam  (XANAX ) 0.5 MG tablet Take 0.5 mg by mouth at bedtime as needed for anxiety. TAKE 3 TIMES DAILY PRN    [provider]  atorvastatin  (LIPITOR) 20 MG tablet TAKE 1 TABLET BY MOUTH EVERY DAY 11/10/21   Hilty, Vinie BROCKS, MD  benazepril  (LOTENSIN ) 40 MG tablet  Take 40 mg by mouth daily at 12 noon.    [provider]  ELIQUIS  5 MG TABS tablet TAKE 1 TABLET BY MOUTH TWICE A DAY 12/19/23   Hilty, Vinie BROCKS, MD  hydrochlorothiazide  (HYDRODIURIL ) 25 MG tablet Take 25 mg by mouth daily. 07/04/17   [provider]  metoprolol  succinate (TOPROL -XL) 50 MG 24 hr tablet Take 1 tablet (50 mg total) by mouth daily. MAY TAKE EXTRA 1/2 DOSE FOR SUSTAINED PALPITATIONS 06/24/23   Emelia Josefa HERO, NP  minocycline  (MINOCIN ,DYNACIN ) 100 MG capsule Take 1 capsule (100 mg total) by mouth 2 (two)  times daily. 04/04/15   Duanne Butler DASEN, MD  pantoprazole  (PROTONIX ) 40 MG tablet Take 1 tablet (40 mg total) by mouth daily. 08/25/22   Randol Simmonds, MD  pregabalin (LYRICA) 50 MG capsule Take 50 mg by mouth 2 (two) times daily. Pt takes 2 capsules twice daily    [provider]  rizatriptan (MAXALT) 10 MG tablet  02/19/20   [provider]  sildenafil (REVATIO) 20 MG tablet Take 20-60 mg by mouth daily as needed (ED).    [provider]    Physical Exam    Vital Signs:  GARNELL PHENIX does not have vital signs available for review today.  Given telephonic nature of communication, physical exam is limited. AAOx3. NAD. Normal affect.  Speech and respirations are unlabored.  Accessory Clinical Findings    None  Assessment & Plan    1.  Preoperative Cardiovascular Risk Assessment: According to the Revised Cardiac Risk Index (RCRI), his Perioperative Risk of Major Cardiac Event is (%): 0.4 His Functional Capacity in METs is: 7.25 according to the Duke Activity Status Index (DASI). The patient is doing well from a cardiac perspective. Therefore, based on ACC/AHA guidelines, the patient would be at acceptable risk for the planned procedure without further cardiovascular testing.    The patient was advised that if he develops new symptoms prior to surgery to contact our office to arrange for a follow-up visit, and he verbalized understanding.  Per office protocol, patient can hold Eliquis  for 1 day prior to procedure and should resume as soon as hemodynamically stable post procedure.  Patient does not require pre-op antibiotics for dental procedure.   A copy of this note will be routed to requesting surgeon.  Time:   Today, I have spent 5 minutes with the patient with telehealth technology discussing medical history, symptoms, and management plan.     [name], NP-C  12/20/2023, 1:44 PM [address] [place], [place] [address] Office  [phone] Fax [phone]

## 2024-01-03 ENCOUNTER — Other Ambulatory Visit: Payer: Self-pay | Admitting: General Practice

## 2024-02-24 ENCOUNTER — Encounter (HOSPITAL_COMMUNITY): Payer: Self-pay | Admitting: Surgery

## 2024-04-13 ENCOUNTER — Other Ambulatory Visit: Payer: Self-pay | Admitting: Internal Medicine
